# Patient Record
Sex: Male | Born: 1950 | Race: Black or African American | Hispanic: No | Marital: Married | Smoking: Former smoker
Health system: Southern US, Community
[De-identification: ages and names within clinical notes are randomized; demographics above are authoritative.]

## PROBLEM LIST (undated history)

## (undated) DIAGNOSIS — I10 Essential (primary) hypertension: Secondary | ICD-10-CM

## (undated) DIAGNOSIS — M109 Gout, unspecified: Secondary | ICD-10-CM

## (undated) DIAGNOSIS — E785 Hyperlipidemia, unspecified: Secondary | ICD-10-CM

## (undated) DIAGNOSIS — E119 Type 2 diabetes mellitus without complications: Secondary | ICD-10-CM

## (undated) HISTORY — PX: NECK SURGERY: SHX720

## (undated) HISTORY — PX: FOOT SURGERY: SHX648

## (undated) HISTORY — PX: KNEE SURGERY: SHX244

---

## 2001-04-15 ENCOUNTER — Ambulatory Visit (HOSPITAL_COMMUNITY): Admission: RE | Admit: 2001-04-15 | Discharge: 2001-04-15 | Payer: Self-pay | Admitting: Family Medicine

## 2001-04-15 ENCOUNTER — Encounter: Payer: Self-pay | Admitting: Family Medicine

## 2001-04-21 ENCOUNTER — Ambulatory Visit (HOSPITAL_COMMUNITY): Admission: RE | Admit: 2001-04-21 | Discharge: 2001-04-21 | Payer: Self-pay | Admitting: Family Medicine

## 2001-04-21 ENCOUNTER — Encounter: Payer: Self-pay | Admitting: Family Medicine

## 2002-01-23 ENCOUNTER — Ambulatory Visit (HOSPITAL_COMMUNITY): Admission: RE | Admit: 2002-01-23 | Discharge: 2002-01-23 | Payer: Self-pay | Admitting: Family Medicine

## 2002-01-23 ENCOUNTER — Encounter: Payer: Self-pay | Admitting: Family Medicine

## 2002-04-09 ENCOUNTER — Ambulatory Visit (HOSPITAL_COMMUNITY): Admission: RE | Admit: 2002-04-09 | Discharge: 2002-04-09 | Payer: Self-pay | Admitting: Neurosurgery

## 2002-09-04 ENCOUNTER — Encounter: Payer: Self-pay | Admitting: Rheumatology

## 2002-09-04 ENCOUNTER — Encounter (HOSPITAL_COMMUNITY): Admission: RE | Admit: 2002-09-04 | Discharge: 2002-10-04 | Payer: Self-pay | Admitting: Rheumatology

## 2002-10-19 ENCOUNTER — Encounter (HOSPITAL_COMMUNITY): Admission: RE | Admit: 2002-10-19 | Discharge: 2002-11-18 | Payer: Self-pay | Admitting: Rheumatology

## 2003-05-02 ENCOUNTER — Ambulatory Visit (HOSPITAL_COMMUNITY): Admission: RE | Admit: 2003-05-02 | Discharge: 2003-05-02 | Payer: Self-pay | Admitting: Orthopaedic Surgery

## 2003-05-02 ENCOUNTER — Encounter: Payer: Self-pay | Admitting: Orthopaedic Surgery

## 2003-08-24 ENCOUNTER — Emergency Department (HOSPITAL_COMMUNITY): Admission: EM | Admit: 2003-08-24 | Discharge: 2003-08-24 | Payer: Self-pay | Admitting: Emergency Medicine

## 2003-08-24 ENCOUNTER — Ambulatory Visit (HOSPITAL_COMMUNITY): Admission: RE | Admit: 2003-08-24 | Discharge: 2003-08-24 | Payer: Self-pay | Admitting: Emergency Medicine

## 2003-12-30 ENCOUNTER — Encounter: Admission: RE | Admit: 2003-12-30 | Discharge: 2003-12-30 | Payer: Self-pay | Admitting: Orthopedic Surgery

## 2004-01-11 ENCOUNTER — Encounter: Admission: RE | Admit: 2004-01-11 | Discharge: 2004-01-11 | Payer: Self-pay | Admitting: Orthopedic Surgery

## 2004-07-08 ENCOUNTER — Emergency Department (HOSPITAL_COMMUNITY): Admission: EM | Admit: 2004-07-08 | Discharge: 2004-07-08 | Payer: Self-pay | Admitting: *Deleted

## 2006-02-08 ENCOUNTER — Emergency Department (HOSPITAL_COMMUNITY): Admission: EM | Admit: 2006-02-08 | Discharge: 2006-02-08 | Payer: Self-pay | Admitting: Emergency Medicine

## 2007-08-19 ENCOUNTER — Ambulatory Visit: Payer: Self-pay | Admitting: Gastroenterology

## 2007-08-23 ENCOUNTER — Encounter: Payer: Self-pay | Admitting: Internal Medicine

## 2007-08-23 ENCOUNTER — Ambulatory Visit: Payer: Self-pay | Admitting: Internal Medicine

## 2007-08-23 ENCOUNTER — Ambulatory Visit (HOSPITAL_COMMUNITY): Admission: RE | Admit: 2007-08-23 | Discharge: 2007-08-23 | Payer: Self-pay | Admitting: Internal Medicine

## 2008-08-10 ENCOUNTER — Emergency Department (HOSPITAL_COMMUNITY): Admission: EM | Admit: 2008-08-10 | Discharge: 2008-08-10 | Payer: Self-pay | Admitting: Emergency Medicine

## 2011-01-20 NOTE — Consult Note (Signed)
NAMECHRISTINO, Ian Roberson             ACCOUNT NO.:  [id number]   MEDICAL RECORD NO.:  [id number]          PATIENT TYPE:  AMB   LOCATION:  DAY                           FACILITY:  [facility]   PHYSICIAN:  R. Roetta Sessions, M.D. DATE OF BIRTH:  1951/08/07   DATE OF CONSULTATION:  08/19/2007  DATE OF DISCHARGE:                                 CONSULTATION   CHIEF COMPLAINT:  Blood in stool.   PHYSICIAN REQUESTING CONSULTATION:  Dr. Floyde Parkins, who was with  the [facility] in [place], [place].   HISTORY OF PRESENT ILLNESS:  Ian Roberson is a 60 year old African-  American gentleman who presents today for further evaluation of  hemoccult-positive stools.  About 2 months ago, he completed take-home  Hemoccults, which were positive.  He had never had a colonoscopy.  Dr.  Jules Husbands has requested him having a colonoscopy at this time.  The patient  also has a history of esophageal strictures and Barrett's esophagus and  was actually supposed to come back for a 2-year follow-up surveillance  EGD, around 2004.  Somehow he was lost to followup.  His last EGD was in  March of 2002, and at that time he had biopsies consistent with  Barrett's, but no evidence of high-grade dysplasia.  He had 58-French  Maloney dilator passed, due to esophageal ring as well.  The patient  states he has had some mild recurrent esophageal solid food dysphagia.  Denies any odynophagia.  His heartburn is well controlled.  His weight  is stable.  No melena or rectal bleeding.  Bowel movements are regular.   CURRENT MEDICATIONS:  1. Methocarbamol 750 mg p.r.n.  2. Omeprazole 20 mg daily.  3. Vicodin and 10/500 mg p.r.n.  4. Trazodone 50 mg q.h.s. p.r.n.  5. Claritin 10 mg daily p.r.n.   ALLERGIES:  PENICILLIN CAUSES CONVULSIONS.   PAST MEDICAL HISTORY:  Gastroesophageal reflux disease, seasonal  allergies, history of esophageal strictures and Barrett's esophagus as  outlined above,  chronic neck and shoulder pain, chronic  back and hip  pain, heart murmur, right and left knee arthroscopy.   FAMILY HISTORY:  Mother is 65, has had a stroke.  Father died with a  massive MI at age 35.  No family history of colorectal cancer.   SOCIAL HISTORY:  He is married.  He has one son who died at age 41, one  son living and healthy.  He is on veteran's disability.  He quit smoking  in 1991.  He smoked for over 15 years.  He occasionally consumes  alcohol.   REVIEW OF SYSTEMS:  See HPI for GI and constitutional.  CARDIOPULMONARY:  Denies any chest pain, shortness of breath, chronic cough or  palpitations.   PHYSICAL EXAM:  VITAL SIGNS:  Weight 214, height 6 feet 2, temp 98.9,  blood pressure 130/88, pulse 88.  GENERAL:  Pleasant, well-nourished, well developed black male in no  acute distress.  SKIN:  Warm, dry.  No jaundice.  HEENT:  Sclerae nonicteric.  Oropharyngeal mucosa moist and pink.  No  lesions, erythema or exudate.  No lymphadenopathy or thyromegaly.  CHEST:  Lungs are clear to auscultation.  CARDIAC:  Exam reveals regular rate and rhythm.  Normal S1-S2.  No  murmurs, rubs or gallops.  ABDOMEN:  Positive bowel sounds.  Abdomen soft, nontender, nondistended.  No organomegaly or masses.  No rebound tenderness or guarding.  No  abdominal bruits or hernias.  EXTREMITIES:  No edema.   IMPRESSION:  Ian Roberson is a pleasant 60 year old gentleman who  presents with hemoccult-positive stools and recurrent dysphagia.  He has  never had a screening colonoscopy.  He does have a history of chronic  GERD with esophageal strictures and Barrett's esophagus and is overdue  for a surveillance EGD at this time.   RECOMMENDATIONS:  Recommend colonoscopy and EGD for further evaluation  of heme-positive stools and recurrent dysphagia.  I discussed the risk,  alternatives, benefits with regards to but not limited to the risk of  reaction to medications, bleeding, infection and perforation.  The  patient is agreeable to  proceed.   PLAN:  1. EGD and colonoscopy with Dr. Jena Gauss in the near future.  2. Continue omeprazole 20 mg daily.  3. Further recommendations to follow.   I would like to thank Dr. Floyde Parkins for allowing [place] to take part  in the care of this patient.      Tana Coast, P.AJonathon Bellows, M.D.  Electronically Signed    LL/MEDQ  D:  08/19/2007  T:  08/19/2007  Job:  [id number]   cc:   Floyde Parkins  Fax: [phone]

## 2011-01-20 NOTE — Op Note (Signed)
Ian Roberson, Ian Roberson             ACCOUNT NO.:  [id number]   MEDICAL RECORD NO.:  [id number]          PATIENT TYPE:  AMB   LOCATION:  DAY                           FACILITY:  [facility]   PHYSICIAN:  R. Roetta Sessions, M.D. DATE OF BIRTH:  1950-11-13   DATE OF PROCEDURE:  08/23/2007  DATE OF DISCHARGE:                               OPERATIVE REPORT   PROCEDURE:  Esophagogastroduodenoscopy with Elease Hashimoto dilation followed by  biopsy followed by diagnostic ileocolonoscopy.   INDICATIONS FOR PROCEDURE:  A 60 year old gentleman, hemoccult positive.  He has recurrent esophageal dysphagia and history of esophageal  stricture previously and reported Barrett's esophagus.  He is African-  [employer].  He has chronic gastroesophageal reflux disease.  He is on  omeprazole 20 mg orally daily.  There is no family history of colorectal  neoplasia.  He has never had a colonoscopy.  EGD and colonoscopy are now  being done.  The potential for esophageal dilation reviewed with Mr.  Roberson.  Risks, benefits and alternatives have been discussed.  All  questions answered.  All parties agreeable.   PROCEDURE NOTE:  O2 saturation, blood pressure, pulses, and respirations  were monitored throughout the entirety of both procedures.  Conscious  sedation with Versed 6 mg IV and Demerol 100 mg IV in divided doses.  Cetacaine spray for topical oropharyngeal anesthesia.   INSTRUMENT:  Pentax video chip system.   FINDINGS:  EGD examination of the tubular esophagus revealed a 2 cm  tongue of salmon-colored epithelium in the distal esophagus, which came  down to the EG junction, where there was a tight Schatzki's ring.  There  was no esophagitis, no evidence of neoplasia.  The EG junction would not  initially admit the scope with pressure.  There was a partial traumatic  dilation of the ring with passage of the scope.   STOMACH:  Gastric cavity was empty and insufflated well with air.  A  thorough examination of the  gastric mucosa, included a retroflexed view  of the proximal stomach and the esophagogastric junction demonstrated  only a hiatal hernia.  The pylorus was patent and easily transversed.  Examination of the bulb and second portion revealed no abnormalities.   THERAPEUTICS/DIAGNOSTIC MANEUVERS PERFORMED:  The scope was withdrawn.  A 56 French Maloney dilator was passed to full insertion.  A look-back  revealed there was excellent rupture of the ring without apparent  complication.  Subsequently, the salmon-colored tongue of epithelium was  biopsied for histologic study.  The patient tolerated the procedure well  and was prepared for colonoscopy.   Digital rectal exam revealed no abnormalities.   ENDOSCOPIC FINDINGS:  Prep was marginal.   COLON:  The colonic mucosa was surveyed from the rectosigmoid junction  to the left transverse, right colon, the appendiceal orifice, the  ileocecal valve, and cecum.  These structures were well seen and  photographed for the record.  The terminal ileum was intubated to 10 cm.  From this level, the scope was slowly withdrawn, and all previously  mentioned mucosal surfaces were again seen.  There was a thin coating of  viscus stool  throughout the colon, which was washed and lavaged out to  gain adequate visualization.  The colonic mucosa, I did not see any  evidence of neoplasm or polyp in reviewing the colonic mucosa on the way  out.  I took at least 10 minutes on withdrawal; however, the prep made  the exam more difficult, and the air/water button on the colonoscope  kept sticking and had to be changed out, which was frustrating.  The  scope was pulled down into the rectum, where a thorough examination of  the rectal mucosa, including retroflexion of the anal verge,  demonstrated no abnormalities.  Patient tolerated both procedures well  and was reactive.   ENDOSCOPY IMPRESSION:  1. A tongue of salmon-colored epithelium in the distal esophagus, a       tight Schatzki's ring, partially dilated with passage of the scope      and then dilated with a 56 French Maloney dilator, status post      biopsy of the distal esophagus.  2. Hiatal hernia of his lower stomach.  Patent pylorus, normal D1, D2.   COLONOSCOPY FINDINGS:  Normal rectum and ileum.  Marginal prep made the  exam more difficult.   RECOMMENDATIONS:  1. Continue omeprazole 20 mg orally daily.  2. Follow up on path.  3. CBC today.  If he is anemic, will consider further evaluation of      his GI tract to evaluate hemoccult positive stool.  It is notable      that he has been taking diclofenac recently.      Ian Roberson, M.D.  Electronically Signed     RMR/MEDQ  D:  08/23/2007  T:  08/23/2007  Job:  [id number]   cc:   Floyde Parkins  Fax: [phone]   [facility]

## 2011-01-23 NOTE — Consult Note (Signed)
NAMEJAKIAH, GOREE                         ACCOUNT NO.:  [id number]   MEDICAL RECORD NO.:  [id number]                   PATIENT TYPE:  OUT   LOCATION:  RAD                                  FACILITY:  [facility]   PHYSICIAN:  Aundra Dubin, M.D.            DATE OF BIRTH:  15-Jul-1951   DATE OF CONSULTATION:  DATE OF DISCHARGE:  04/09/2002                                   CONSULTATION   CHIEF COMPLAINT:  Neck and shoulder pain.   REFERRING PHYSICIAN:  J. Darreld Mclean, M.D.   Dear Deniece Portela:   Thank you for this consultation.  Mr. Hillery is a 60 year old white male  who has had several areas of arthralgias.  I have read through Dr. Sanjuan Dame  notes as he has seen Mr. Wieck approximately seven to eight times from  05/03/02 to 08/11/02.   Concerning the shoulder and neck pain, which is much worse on the right than  the left, this began in 4/02.  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  This is a constant pain throughout the neck  and shoulder.  He also describes it as sharp and pinching.  He finds that it  is quite hard to sleep because of the pain  in the right shoulder.  He feels  that it is somewhat difficult to lift.   Mr. Manna has worked with Dr. Hilda Lias concerning right hip and knee pain.  This type of pain began in 3/03.  He was evaluated by Dr. Lovell Sheehan, a  neurosurgeon.  He had a normal MRI of the hip.  He had a lumbar spine MRI on  01/23/02 showing mild disk degeneration at L2-3, L3-4, and L4-5.  It was also  noting mild diffuse disk bulging at the L2-3 level.  There was no  significant stenosis or HNP.  Mr. Hoopes describes having severe pain in  the right hip and knee when standing longer than 15 or 20 minutes.  He says  it starts around the kneecap and moves upward and points to the superior  lateral iliac crest.  He feels that there is gristle that pops  in this  area.  After he stands he says the whole leg will hurt.  He describes this  as a sharp pain.  There is radiation from the lateral hip area to the back.  He has had arthroscopy to the right knee by Dr. Hilda Lias in 1999.  The MRI of  the right knee on 04/09/02 showed an intact lateral meniscus and a tear to the  body and posterior horn of the medial meniscus.   On further review of systems he denies any joint swelling.  He denies fever,  rashes, or weight loss.  His energy is reasonable and he says it is fairly  well.  He has the nonrestorative sleep.  There has been psoriasis,  photosensitivity, or loss of alopecia, pleurisy, headaches, Raynaud's, or  significant vision changes.  Sometimes his eyes will burn but I doubt this  is an inflammatory symptom.  He denies diarrhea, constipation, blood or  mucus in the bowel movement.  He had chest pain every three to four months  in the center of the chest that was burning and tight.  He underwent a  stress test which I believe was negative.   Past medical and surgical history, with reflux.  Right knee arthroscopy in  1999.  Right hip, shoulder, neck, and back pain as described above.  Endoscopy in 2002.  A cyst removed from the right abdomen.  Left knee  arthroscopy in 1996.   Medicines, he recently finished a Medrol Dosepak.  Advil p.r.n.  Prevacid  p.r.n.  He is presently not taking Celebrex.   His drug intolerances are penicillin and cortisone.   Family history is with no significant history of arthritis.   Social history, he is presently out of work.  He does not smoke or drink  alcohol.   VITAL SIGNS: His weight is 215 pounds, height 6 feet 2 inches.  Blood  pressure 140/92, respirations 16.  GENERAL: In general, this is a healthy  appearing man.  SKIN:  Clear.  HEENT: PERRL/EOMI.  Mouth moderately poor dentition.  No ulcers or petechia.  NECK: Normal thyroid.  Negative JVD.  LUNGS: Clear.  HEART: Regular.  No murmur.   ABDOMEN: Negative.  Nontender.  MUSCULOSKELETAL: The hands, wrists, elbows, and shoulders all have a good  range of motion and show no pain.  There was minor wincing with completely  full internal and external range of motion to the shoulders.  He was able to  actively put the shoulders through a range of motion that showed no wincing  or deficit.  He was able to abduct the shoulders easily to 180 degrees with  no problems.  Trigger points at the elbows, shoulders, neck, occiput,  anterior chest, and lumbar paraspinous muscles were nontender except  slightly to the tops of the shoulders.  He was able to stand on a forward  flexion and touch the floor fairly easily.  The second time he went down he  said that there was some light low back pain.  Hips, knees, ankles, and feet  have a painless full range of motion but showed no synovitis.  The right  knee was cool and nontender with flexion to 135 degrees. There was no joint  line tenderness.  Gait normal.  NEUROLOGIC: Strength was 5/5.  DTRs were 2+ throughout and negative SLR.   ASSESSMENT:   PLAN:  1. Shoulder and neck.  As I have discussed with the patient I find that his     entire examination is normal.  There was no guarding with any movement of     any joints and they were nontender to palpation.  I do not think that the     shoulder requires an injection and will not do this.  I will have the     shoulder x-rayed.  I believe it is reasonable to have go for a few     sessions of physical therapy to learn a range of motion that he can do at     home.  2. Insomnia.  I do not believe that this is a fibromyalgia process.  He is     not sleeping  well because of the shoulder pain and I have placed him on     Flexeril to see if this will help with his sleep and also have given him     Darvocet that he can use during the day. I have also encouraged him to     return using Celebrex 200 mg q.d. 3. Back pain.  The examination is normal.  He  has had essentially     unremarkable MRIs of the thoracic, lumbar, and hip.  I do not believe     that this is a radiating type of pain and will not work it up further as     it already has been.  He is some improved from a recent Medrol Dosepak     and he says that this dulled the pain.  4. Right knee.  The knee has a problem with the posterior meniscus but the     examination is quite normal.  His gait was normal also.   Wayne, as above I find that Mr. Jagoda appears healthy and has a  completely normal examination.  I do not have a full explanation as to why  he is aching and hurting so much.  I await the results of the x-rays and we  will check a  CBC, CMET, and ESR.  I will see him back in about five to six weeks to  further evaluate him.  At the present time I do not believe that he has  fibromyalgia.  Thank you.   Sincerely,                                                 Aundra Dubin, M.D.    WWT/MEDQ  D:  09/04/2002  T:  09/04/2002  Job:  [id number]

## 2011-01-23 NOTE — Consult Note (Signed)
Ian Roberson, Ian Roberson NO.:  [id number]   MEDICAL RECORD NO.:  [id number]                   PATIENT TYPE:   LOCATION:                                       FACILITY:  [facility]   PHYSICIAN:  Aundra Dubin, M.D.            DATE OF BIRTH:  06/27/1951   DATE OF CONSULTATION:  10/19/2002  DATE OF DISCHARGE:                                   CONSULTATION   CHIEF COMPLAINT:  Neck and shoulder pain, back pain.   HISTORY OF PRESENT ILLNESS:  The patient did go for physical therapy and  reports that after about the third session he began to have significant  reduction in the shoulder and neck, tightness and pain.  He says that he can  hardly feel it now.  He is sleeping better and did not need much of the  Flexeril.  He does find that the Darvocet helps him sleep at night.  At this  time he says that he is still having some difficulty with his right low back  and pain down to the right knee.  In review of the chart he had some mild  disk degeneration from L2 to L5 by an MRI.  I have reviewed his right  shoulder x-ray from September 04, 2002 which is essentially normal except for  a benign bony finding of os acromiale.  Laboratories from September 04, 2002  showed an AST 88, ALT 86, bilirubin 1.1, glucose 105, creatinine 1.0.  WBC  8.3, HGB 14.6, PLT 226,000, ESR 2.   DISCHARGE MEDICATIONS:  1. Darvocet one daily.  2. Prevacid p.r.n.  3. Off Celebrex.  4. Flexeril rare.   PHYSICAL EXAMINATION:  VITAL SIGNS:  Weight 214 pounds, blood pressure  122/80, respirations 16.  GENERAL:  He appears well.  Gait nonantalgic.  SKIN:  Clear.  LUNGS:  Clear.  HEART:  Regular.  No murmur.  EXTREMITIES:  Lower extremities:  No edema.  MUSCULOSKELETAL:  The hands, wrists, elbows, shoulders, and neck have a good  range of motion and now there is no stiffness to the shoulders.  Trigger  points around the shoulder, neck, occiput, anterior chest, and throughout  the  paraspinous muscles and low back are all nontender.  Hips:  Full range  of motion without pain.  The knees are cool.  The right knee has some slight  fullness compared to the left.  There is no joint line tenderness and no  pain with flexion to 135 degrees.   ASSESSMENT/PLAN:  1. Shoulder and neck pain.  This was his main complaint when I saw him in     late December and this is significantly improved.  He will continue with     the Darvocet p.r.n.  The physical therapist working with the patient     rates his improvement at 97%.  He is released to go back to work.  2. Back pain.  His examination seems overall quite good.  I would allow him     to return to work if this is okayed by     his company physician.  3. Increased LFTs.  We will repeat these tests.  He did have negative     antibodies to hepatitis B and C.  4. He will return on a p.r.n. basis.                                               Aundra Dubin, M.D.    WWT/MEDQ  D:  10/19/2002  T:  10/19/2002  Job:  [id number]   cc:   Teola Bradley, M.D.  [phone] [address]  [place] [address]  Fax: [phone]

## 2011-06-11 LAB — DIFFERENTIAL
Basophils Absolute: 0 10*3/uL (ref 0.0–0.1)
Basophils Relative: 1 % (ref 0–1)
Eosinophils Absolute: 0.2 10*3/uL (ref 0.0–0.7)
Eosinophils Relative: 2 % (ref 0–5)
Lymphocytes Relative: 31 % (ref 12–46)
Lymphs Abs: 2.4 10*3/uL (ref 0.7–4.0)
Monocytes Absolute: 0.4 10*3/uL (ref 0.1–1.0)
Monocytes Relative: 5 % (ref 3–12)
Neutro Abs: 4.8 10*3/uL (ref 1.7–7.7)
Neutrophils Relative %: 62 % (ref 43–77)

## 2011-06-11 LAB — CBC
MCHC: 33.7 g/dL (ref 30.0–36.0)
RDW: 13.3 % (ref 11.5–15.5)

## 2011-06-11 LAB — COMPREHENSIVE METABOLIC PANEL
ALT: 28 U/L (ref 0–53)
AST: 33 U/L (ref 0–37)
BUN: 8 mg/dL (ref 6–23)
Chloride: 104 mEq/L (ref 96–112)
Creatinine, Ser: 0.95 mg/dL (ref 0.4–1.5)
GFR calc Af Amer: >60 mL/min (ref >60)
Glucose, Bld: 122 mg/dL — ABNORMAL HIGH (ref 70–99)

## 2011-06-11 LAB — POCT CARDIAC MARKERS
CKMB, poc: <1 ng/mL — ABNORMAL LOW (ref 1.0–8.0)
Troponin i, poc: <0.05 ng/mL (ref 0.00–0.09)
Troponin i, poc: <0.05 ng/mL (ref 0.00–0.09)

## 2011-06-11 LAB — D-DIMER, QUANTITATIVE: D-Dimer, Quant: 0.23 ug/mL-FEU (ref 0.00–0.48)

## 2011-06-12 LAB — CBC
MCHC: 33.4
MCV: 83.9
Platelets: 230
RDW: 13.2

## 2012-02-14 ENCOUNTER — Encounter (HOSPITAL_COMMUNITY): Payer: Self-pay | Admitting: Emergency Medicine

## 2012-02-14 ENCOUNTER — Emergency Department (HOSPITAL_COMMUNITY): Payer: Medicare Other

## 2012-02-14 ENCOUNTER — Emergency Department (HOSPITAL_COMMUNITY)
Admission: EM | Admit: 2012-02-14 | Discharge: 2012-02-15 | Disposition: A | Discharge status: Home / Self Care | Payer: Medicare Other | Attending: Emergency Medicine | Admitting: Emergency Medicine

## 2012-02-14 DIAGNOSIS — R079 Chest pain, unspecified: Secondary | ICD-10-CM | POA: Insufficient documentation

## 2012-02-14 DIAGNOSIS — E785 Hyperlipidemia, unspecified: Secondary | ICD-10-CM | POA: Insufficient documentation

## 2012-02-14 DIAGNOSIS — Z8249 Family history of ischemic heart disease and other diseases of the circulatory system: Secondary | ICD-10-CM | POA: Insufficient documentation

## 2012-02-14 DIAGNOSIS — I10 Essential (primary) hypertension: Secondary | ICD-10-CM | POA: Insufficient documentation

## 2012-02-14 DIAGNOSIS — R109 Unspecified abdominal pain: Secondary | ICD-10-CM | POA: Insufficient documentation

## 2012-02-14 HISTORY — DX: Hyperlipidemia, unspecified: E78.5

## 2012-02-14 HISTORY — DX: Essential (primary) hypertension: I10

## 2012-02-14 HISTORY — DX: Gout, unspecified: M10.9

## 2012-02-14 LAB — BASIC METABOLIC PANEL
BUN: 12 mg/dL (ref 6–23)
Calcium: 10.5 mg/dL (ref 8.4–10.5)
Chloride: 98 mEq/L (ref 96–112)
Creatinine, Ser: 1 mg/dL (ref 0.50–1.35)
Potassium: 3.6 mEq/L (ref 3.5–5.1)

## 2012-02-14 LAB — CBC
HCT: 41 % (ref 39.0–52.0)
MCHC: 34.4 g/dL (ref 30.0–36.0)
Platelets: 220 10*3/uL (ref 150–400)
RBC: 4.96 MIL/uL (ref 4.22–5.81)
RDW: 13.2 % (ref 11.5–15.5)

## 2012-02-14 LAB — LIPASE, BLOOD: Lipase: 35 U/L (ref 11–59)

## 2012-02-14 LAB — POCT I-STAT TROPONIN I

## 2012-02-14 MED ORDER — ASPIRIN 81 MG PO CHEW
324.0000 mg | CHEWABLE_TABLET | Freq: Once | ORAL | Status: AC
  Administered 2012-02-14: 324 mg via ORAL
  Filled 2012-02-14: qty 4

## 2012-02-14 MED ORDER — MORPHINE SULFATE 4 MG/ML IJ SOLN
4.0000 mg | Freq: Once | INTRAMUSCULAR | Status: AC
  Administered 2012-02-14: 4 mg via INTRAVENOUS
  Filled 2012-02-14: qty 1

## 2012-02-14 MED ORDER — ONDANSETRON HCL 4 MG/2ML IJ SOLN
4.0000 mg | Freq: Once | INTRAMUSCULAR | Status: AC
  Administered 2012-02-14: 4 mg via INTRAVENOUS
  Filled 2012-02-14: qty 2

## 2012-02-14 NOTE — ED Notes (Signed)
Patient complaining of sharp chest pain starting today about 1900.

## 2012-02-15 ENCOUNTER — Emergency Department (HOSPITAL_COMMUNITY): Payer: Medicare Other

## 2012-02-15 LAB — HEPATIC FUNCTION PANEL
ALT: 35 U/L (ref 0–53)
AST: 46 U/L — ABNORMAL HIGH (ref 0–37)
Alkaline Phosphatase: 69 U/L (ref 39–117)
Bilirubin, Direct: 0.1 mg/dL (ref 0.0–0.3)
Indirect Bilirubin: 0.3 mg/dL (ref 0.3–0.9)
Total Bilirubin: 0.4 mg/dL (ref 0.3–1.2)

## 2012-02-15 MED ORDER — HYDROCODONE-ACETAMINOPHEN 5-325 MG PO TABS: 1.0000 | ORAL_TABLET | Freq: Four times a day (QID) | ORAL | Status: AC | PRN

## 2012-02-15 MED ORDER — IOHEXOL 350 MG/ML SOLN
100.0000 mL | Freq: Once | INTRAVENOUS | Status: AC | PRN
  Administered 2012-02-15: 100 mL via INTRAVENOUS

## 2012-02-15 MED ORDER — FAMOTIDINE 20 MG PO TABS: 20.0000 mg | ORAL_TABLET | Freq: Two times a day (BID) | ORAL | Status: DC

## 2012-02-15 NOTE — ED Provider Notes (Signed)
History     CSN: [id number]  Arrival date & time 02/14/12  2218   First MD Initiated Contact with Patient 02/14/12 2234      Chief Complaint  Patient presents with  . Chest Pain    (Consider location/radiation/quality/duration/timing/severity/associated sxs/prior treatment) HPI Comments: Ian Roberson was relaxing at home when he developed sudden onset of midsternal sharp chest pain which radiates around his right lower ribs and in in his right flank.  His pain is sharp and constant but worse with deep inspiration.  He denies nausea vomiting diaphoresis and shortness of breath.  He denies any previous episodes of similar symptoms.  His last meal was approximately 5 hours before this event and was not particularly fatty, stating he had corn, green beans and pork roast.  There have been no identifiable triggers for his symptoms as they occurred at rest.  Past medical history is significant for hypertension and hyperlipidemia.  He also has a strong family history of cardiac disease, his father died of a massive MI at age 60.  His primary doctor is at the [facility] in [place].  He has taken no medications prior to arrival here.   Patient is a 61 y.o. male presenting with chest pain. The history is provided by the patient.  Chest Pain Pertinent negatives for primary symptoms include no fever, no shortness of breath, no abdominal pain, no nausea and no dizziness.  Pertinent negatives for associated symptoms include no numbness and no weakness.     Past Medical History  Diagnosis Date  . Hypertension   . Hyperlipidemia   . Gout     Past Surgical History  Procedure Date  . Knee surgery   . Neck surgery     History reviewed. No pertinent family history.  History  Substance Use Topics  . Smoking status: Former Games developer  . Smokeless tobacco: Not on file  . Alcohol Use: Yes     weekends      Review of Systems  Constitutional: Negative for fever.  HENT: Negative for congestion,  sore throat and neck pain.   Eyes: Negative.   Respiratory: Negative for chest tightness and shortness of breath.   Cardiovascular: Positive for chest pain.  Gastrointestinal: Negative for nausea and abdominal pain.  Genitourinary: Negative.   Musculoskeletal: Negative for joint swelling and arthralgias.  Skin: Negative.  Negative for rash and wound.  Neurological: Negative for dizziness, weakness, light-headedness, numbness and headaches.  Hematological: Negative.   Psychiatric/Behavioral: Negative.     Allergies  Penicillins  Home Medications  No current outpatient prescriptions on file.  BP 148/85  Pulse 70  Temp(Src) 98.1 F (36.7 C) (Oral)  Resp 20  Ht 6\' 2"  (1.88 m)  Wt 230 lb (104.327 kg)  BMI 29.53 kg/m2  SpO2 99%  Physical Exam  Nursing note and vitals reviewed. Constitutional: He appears well-developed and well-nourished.  HENT:  Head: Normocephalic and atraumatic.  Eyes: Conjunctivae are normal.  Neck: Normal range of motion.  Cardiovascular: Normal rate, regular rhythm, normal heart sounds and intact distal pulses.  Exam reveals no friction rub.   No murmur heard. Pulmonary/Chest: Effort normal and breath sounds normal. He has no wheezes. He exhibits tenderness.    Abdominal: Soft. Bowel sounds are normal. There is no hepatosplenomegaly. There is tenderness in the right upper quadrant. There is no rebound, no guarding, no CVA tenderness and negative Murphy's sign.    Musculoskeletal: Normal range of motion.  Neurological: He is alert.  Skin: Skin is warm  and dry.  Psychiatric: He has a normal mood and affect.    ED Course  Procedures (including critical care time)  Labs Reviewed  BASIC METABOLIC PANEL - Abnormal; Notable for the following:    Sodium 134 (*)    Glucose, Bld 126 (*)    GFR calc non Af Amer 80 (*)    All other components within normal limits  HEPATIC FUNCTION PANEL - Abnormal; Notable for the following:    AST 46 (*)    All other  components within normal limits  D-DIMER, QUANTITATIVE - Abnormal; Notable for the following:    D-Dimer, Quant 0.64 (*)    All other components within normal limits  CBC  POCT I-STAT TROPONIN I  LIPASE, BLOOD   Dg Chest Portable 1 View  02/14/2012  *RADIOLOGY REPORT*  Clinical Data: Chest pain  PORTABLE CHEST - 1 VIEW  Comparison: 08/10/2008  Findings: Heart size upper normal limits.  Mediastinal contours otherwise within normal range.  No focal consolidation.  No pleural effusion or pneumothorax.  No acute osseous finding.  IMPRESSION: No radiographic evidence of acute cardiopulmonary process.  Original Report Authenticated By: Waneta Martins, M.D.     No diagnosis found.  Patient received near complete relief of pain with morphine 4 mg IV.  Also given asa 324 mg PO.    MDM  Discussed case with Dr. Deretha Emory who agrees with plan for ct abd/pelvis to rule out gallbladder source of pain.  With elevated d dimer,  CT angio to be completed at same time to rule out PE given pleuritic nature of sx.  Dr. Deretha Emory to assume care of patient.    Date: 02/14/2012  Rate: 89  Rhythm: normal sinus rhythm and premature ventricular contractions (PVC)  QRS Axis: left  Intervals: QT prolonged  ST/T Wave abnormalities: normal  Conduction Disutrbances:none  Narrative Interpretation:   Old EKG Reviewed: unchanged          Burgess Amor, PA 02/15/12 0150

## 2012-02-15 NOTE — Discharge Instructions (Signed)
Workup in the emergency department the negative which included a CAT scan of your chest and a CAT scan of your abdomen and pelvis. As well as the cardiac markers were negative. [facility]. Return for new or worse symptoms. Recommend a trial of Pepcid 20 mg twice a day for the next 2 weeks.

## 2012-02-15 NOTE — ED Provider Notes (Addendum)
Medical screening examination/treatment/procedure(s) were conducted as a shared visit with non-physician practitioner(s) and myself.  I personally evaluated the patient during the encounter  Patient seen by me 2 concerns one is elevated d-dimer with CT angiogram rule out PE. Not likely to be any acute cardiac event the other concern is symptoms very suggestive of biliary colic and the symptoms have persisted CT of the abdomen and pelvis to rule out gallstones as well as any other abnormalities in the right upper quadrant. A CT angiogram CT abdomen pelvis we followed up by me.    Results for orders placed during the hospital encounter of 02/14/12  CBC      Component Value Range   WBC 9.0  4.0 - 10.5 (K/uL)   RBC 4.96  4.22 - 5.81 (MIL/uL)   Hemoglobin 14.1  13.0 - 17.0 (g/dL)   HCT 78.2  95.6 - 21.3 (%)   MCV 82.7  78.0 - 100.0 (fL)   MCH 28.4  26.0 - 34.0 (pg)   MCHC 34.4  30.0 - 36.0 (g/dL)   RDW 08.6  57.8 - 46.9 (%)   Platelets 220  150 - 400 (K/uL)  BASIC METABOLIC PANEL      Component Value Range   Sodium 134 (*) 135 - 145 (mEq/L)   Potassium 3.6  3.5 - 5.1 (mEq/L)   Chloride 98  96 - 112 (mEq/L)   CO2 23  19 - 32 (mEq/L)   Glucose, Bld 126 (*) 70 - 99 (mg/dL)   BUN 12  6 - 23 (mg/dL)   Creatinine, Ser 6.29  0.50 - 1.35 (mg/dL)   Calcium 52.8  8.4 - 10.5 (mg/dL)   GFR calc non Af Amer 80 (*) >90 (mL/min)   GFR calc Af Amer >90  >90 (mL/min)  POCT I-STAT TROPONIN I      Component Value Range   Troponin i, poc 0.02  0.00 - 0.08 (ng/mL)   Comment 3           LIPASE, BLOOD      Component Value Range   Lipase 35  11 - 59 (U/L)  HEPATIC FUNCTION PANEL      Component Value Range   Total Protein 7.7  6.0 - 8.3 (g/dL)   Albumin 4.3  3.5 - 5.2 (g/dL)   AST 46 (*) 0 - 37 (U/L)   ALT 35  0 - 53 (U/L)   Alkaline Phosphatase 69  39 - 117 (U/L)   Total Bilirubin 0.4  0.3 - 1.2 (mg/dL)   Bilirubin, Direct 0.1  0.0 - 0.3 (mg/dL)   Indirect Bilirubin 0.3  0.3 - 0.9 (mg/dL)  D-DIMER,  QUANTITATIVE      Component Value Range   D-Dimer, Quant 0.64 (*) 0.00 - 0.48 (ug/mL-FEU)   Ct Angio Chest W/cm &/or Wo Cm  02/15/2012  *RADIOLOGY REPORT*  Clinical Data: Chest pain  CT ANGIOGRAPHY CHEST  Technique:  Multidetector CT imaging of the chest using the standard protocol during bolus administration of intravenous contrast. Multiplanar reconstructed images including MIPs were obtained and reviewed to evaluate the vascular anatomy.  Contrast: OMNIPAQUE IOHEXOL 350 MG/ML SOLN  Comparison: 02/14/2012 radiograph  Findings: No pulmonary arterial branch filling defect identified. Normal caliber aorta.  Heart size upper normal to mildly enlarged. Trace pericardial fluid.  No pleural effusion.  Mild distal esophageal wall thickening.  No intrathoracic lymphadenopathy.  Central airways are patent.  Minimal dependent atelectasis.  Lungs are otherwise clear.  No pneumothorax.  See separate abdomen  report.  No acute osseous finding.  Multilevel degenerative changes.  IMPRESSION: No pulmonary embolism.  Cardiomegaly and trace pericardial fluid.  Distal esophageal wall thickening is nonspecific however can be seen with gastroesophageal reflux disease or esophagitis.  Otherwise, no acute intrathoracic process identified.  Original Report Authenticated By: Waneta Martins, M.D.   Ct Abdomen Pelvis W Contrast  02/15/2012  *RADIOLOGY REPORT*  Clinical Data: Chest pain, abdominal pain.  CT ABDOMEN AND PELVIS WITH CONTRAST  Technique:  Multidetector CT imaging of the abdomen and pelvis was performed following the standard protocol during bolus administration of intravenous contrast.  Contrast: OMNIPAQUE IOHEXOL 350 MG/ML SOLN  Comparison: None.  Findings: See separate chest report.  Unremarkable liver, biliary system, spleen, pancreas, adrenal glands.  Symmetric renal enhancement.  No hydronephrosis or hydroureter.  No urinary tract calculi identified.  No bowel obstruction.  No CT evidence for colitis.   Appendix within normal limits.  No free intraperitoneal air or fluid.  No lymphadenopathy.  There is scattered atherosclerotic calcification of the aorta and its branches. No aneurysmal dilatation.  Multilevel degenerative changes of the imaged spine. No acute or aggressive appearing osseous lesion.  IMPRESSION: No acute abnormality identified by CT within the abdomen pelvis.  Original Report Authenticated By: Waneta Martins, M.D.   Dg Chest Portable 1 View  02/14/2012  *RADIOLOGY REPORT*  Clinical Data: Chest pain  PORTABLE CHEST - 1 VIEW  Comparison: 08/10/2008  Findings: Heart size upper normal limits.  Mediastinal contours otherwise within normal range.  No focal consolidation.  No pleural effusion or pneumothorax.  No acute osseous finding.  IMPRESSION: No radiographic evidence of acute cardiopulmonary process.  Original Report Authenticated By: Waneta Martins, M.D.     Patient scans are negative for any evidence of gallstones also negative for pulmonary embolism. Patient troponin was negative after 6 hours findings not consistent with a 2 cardiac event we'll treat with pain medicine and discharged home and followup with her regular doctor.   Shelda Jakes, MD 02/15/12 1610  Shelda Jakes, MD 02/15/12 [id number]

## 2013-09-25 ENCOUNTER — Emergency Department (HOSPITAL_COMMUNITY)
Admission: EM | Admit: 2013-09-25 | Discharge: 2013-09-25 | Disposition: A | Discharge status: Home / Self Care | Payer: Medicare Other | Attending: Emergency Medicine | Admitting: Emergency Medicine

## 2013-09-25 ENCOUNTER — Encounter (HOSPITAL_COMMUNITY): Payer: Self-pay | Admitting: Emergency Medicine

## 2013-09-25 ENCOUNTER — Emergency Department (HOSPITAL_COMMUNITY): Payer: Medicare Other

## 2013-09-25 DIAGNOSIS — I10 Essential (primary) hypertension: Secondary | ICD-10-CM | POA: Insufficient documentation

## 2013-09-25 DIAGNOSIS — Z880 Allergy status to penicillin: Secondary | ICD-10-CM | POA: Insufficient documentation

## 2013-09-25 DIAGNOSIS — Z87891 Personal history of nicotine dependence: Secondary | ICD-10-CM | POA: Insufficient documentation

## 2013-09-25 DIAGNOSIS — E785 Hyperlipidemia, unspecified: Secondary | ICD-10-CM | POA: Insufficient documentation

## 2013-09-25 DIAGNOSIS — J111 Influenza due to unidentified influenza virus with other respiratory manifestations: Secondary | ICD-10-CM | POA: Insufficient documentation

## 2013-09-25 DIAGNOSIS — J069 Acute upper respiratory infection, unspecified: Secondary | ICD-10-CM | POA: Insufficient documentation

## 2013-09-25 LAB — CBC WITH DIFFERENTIAL/PLATELET
BASOS ABS: 0 10*3/uL (ref 0.0–0.1)
Basophils Relative: 0 % (ref 0–1)
Eosinophils Absolute: 0.3 10*3/uL (ref 0.0–0.7)
Eosinophils Relative: 3 % (ref 0–5)
HEMATOCRIT: 44.9 % (ref 39.0–52.0)
HEMOGLOBIN: 15.4 g/dL (ref 13.0–17.0)
LYMPHS PCT: 9 % — AB (ref 12–46)
Lymphs Abs: 1.1 10*3/uL (ref 0.7–4.0)
MCH: 28.4 pg (ref 26.0–34.0)
MCHC: 34.3 g/dL (ref 30.0–36.0)
MCV: 82.8 fL (ref 78.0–100.0)
MONO ABS: 0.8 10*3/uL (ref 0.1–1.0)
MONOS PCT: 7 % (ref 3–12)
NEUTROS ABS: 9 10*3/uL — AB (ref 1.7–7.7)
Neutrophils Relative %: 80 % — ABNORMAL HIGH (ref 43–77)
Platelets: 198 10*3/uL (ref 150–400)
RBC: 5.42 MIL/uL (ref 4.22–5.81)
RDW: 13.2 % (ref 11.5–15.5)
WBC: 11.2 10*3/uL — AB (ref 4.0–10.5)

## 2013-09-25 LAB — BASIC METABOLIC PANEL
BUN: 11 mg/dL (ref 6–23)
CHLORIDE: 99 meq/L (ref 96–112)
CO2: 23 meq/L (ref 19–32)
CREATININE: 1.03 mg/dL (ref 0.50–1.35)
Calcium: 9.5 mg/dL (ref 8.4–10.5)
GFR calc Af Amer: 89 mL/min — ABNORMAL LOW (ref >90)
GFR calc non Af Amer: 77 mL/min — ABNORMAL LOW (ref >90)
Glucose, Bld: 106 mg/dL — ABNORMAL HIGH (ref 70–99)
POTASSIUM: 3.8 meq/L (ref 3.7–5.3)
Sodium: 138 mEq/L (ref 137–147)

## 2013-09-25 LAB — TROPONIN I

## 2013-09-25 MED ORDER — PREDNISONE 50 MG PO TABS
60.0000 mg | ORAL_TABLET | Freq: Once | ORAL | Status: AC
  Administered 2013-09-25: 60 mg via ORAL
  Filled 2013-09-25 (×2): qty 1

## 2013-09-25 MED ORDER — HYDROCOD POLST-CHLORPHEN POLST 10-8 MG/5ML PO LQCR: 5.0000 mL | Freq: Two times a day (BID) | ORAL | Status: DC | PRN

## 2013-09-25 MED ORDER — PREDNISONE 20 MG PO TABS: ORAL_TABLET | ORAL | Status: DC

## 2013-09-25 MED ORDER — IPRATROPIUM BROMIDE 0.02 % IN SOLN
0.5000 mg | Freq: Once | RESPIRATORY_TRACT | Status: AC
  Administered 2013-09-25: 0.5 mg via RESPIRATORY_TRACT
  Filled 2013-09-25: qty 2.5

## 2013-09-25 MED ORDER — ALBUTEROL SULFATE (2.5 MG/3ML) 0.083% IN NEBU
5.0000 mg | INHALATION_SOLUTION | Freq: Once | RESPIRATORY_TRACT | Status: AC
  Administered 2013-09-25: 5 mg via RESPIRATORY_TRACT
  Filled 2013-09-25: qty 6

## 2013-09-25 NOTE — ED Provider Notes (Signed)
CSN: [id number]     Arrival date & time 09/25/13  1709 History   First MD Initiated Contact with Patient 09/25/13 1807     Chief Complaint  Patient presents with  . Influenza  . Shortness of Breath   (Consider location/radiation/quality/duration/timing/severity/associated sxs/prior Treatment) HPI.... cough, shortness of breath, fever since Saturday. Nonsmoker. Exertion makes symptoms worse. He is coughing incessantly. No chills or rusty sputum. Severity is moderate. Has tried over-the-counter products.  Past Medical History  Diagnosis Date  . Hypertension   . Hyperlipidemia   . Gout    Past Surgical History  Procedure Laterality Date  . Knee surgery    . Neck surgery    . Foot surgery     No family history on file. History  Substance Use Topics  . Smoking status: Former Games developermoker  . Smokeless tobacco: Not on file  . Alcohol Use: Yes     Comment: weekends    Review of Systems  All other systems reviewed and are negative.    Allergies  Penicillins  Home Medications   Current Outpatient Rx  Name  Route  Sig  Dispense  Refill  . chlorpheniramine-HYDROcodone (TUSSIONEX PENNKINETIC ER) 10-8 MG/5ML LQCR   Oral   Take 5 mLs by mouth every 12 (twelve) hours as needed for cough.   120 mL   0   . EXPIRED: famotidine (PEPCID) 20 MG tablet   Oral   Take 1 tablet (20 mg total) by mouth 2 (two) times daily.   30 tablet   0   . predniSONE (DELTASONE) 20 MG tablet      3 tabs po day one, then 2 po daily x 4 days   11 tablet   0    BP 151/76  Pulse 110  Temp(Src) 100.9 F (38.3 C) (Oral)  Resp 20  Ht 6\' 2"  (1.88 m)  Wt 252 lb (114.306 kg)  BMI 32.34 kg/m2  SpO2 97% Physical Exam  Nursing note and vitals reviewed. Constitutional: He is oriented to person, place, and time. He appears well-developed and well-nourished.  HENT:  Head: Normocephalic and atraumatic.  Eyes: Conjunctivae and EOM are normal. Pupils are equal, round, and reactive to light.  Neck: Normal  range of motion. Neck supple.  Cardiovascular: Normal rate, regular rhythm and normal heart sounds.   Pulmonary/Chest: Effort normal.  Minimal expiratory wheeze bilaterally  Abdominal: Soft. Bowel sounds are normal.  Musculoskeletal: Normal range of motion.  Neurological: He is alert and oriented to person, place, and time.  Skin: Skin is warm and dry.  Psychiatric: He has a normal mood and affect. His behavior is normal.    ED Course  Procedures (including critical care time) Labs Review Labs Reviewed  CBC WITH DIFFERENTIAL - Abnormal; Notable for the following:    WBC 11.2 (*)    Neutrophils Relative % 80 (*)    Neutro Abs 9.0 (*)    Lymphocytes Relative 9 (*)    All other components within normal limits  BASIC METABOLIC PANEL - Abnormal; Notable for the following:    Glucose, Bld 106 (*)    GFR calc non Af Amer 77 (*)    GFR calc Af Amer 89 (*)    All other components within normal limits  TROPONIN I   Imaging Review Dg Chest 2 View  09/25/2013   CLINICAL DATA:  Cough and shortness of breath.  EXAM: CHEST  2 VIEW  COMPARISON:  CT chest 02/15/2012. Single view of the chest 02/14/2012.  FINDINGS: Heart size is upper normal. The lungs are clear without edema or consolidative process. No pneumothorax or pleural effusion.  IMPRESSION: No acute disease.   Electronically Signed   By: Drusilla Kanner M.D.   On: 09/25/2013 18:19    EKG Interpretation   None       MDM   1. URI (upper respiratory infection)    Patient is oxygenating well. Chest x-ray negative for pneumonia. History and physical consistent with viral syndrome. Discharge medications albuterol inhaler, prednisone, Tussionex cough syrup.    Donnetta Hutching, MD 09/25/13 2128

## 2013-09-25 NOTE — ED Notes (Signed)
Went in room to assess pt. Pt not in room.

## 2013-09-25 NOTE — Discharge Instructions (Signed)
Chest x-ray shows no pneumonia. Increase fluids. Tylenol.  Use your inhaler. Prescription for prednisone and cough syrup

## 2013-09-25 NOTE — ED Notes (Signed)
Pt reports flu-like symptoms since Friday, hard to catch his breath at times, +cough that is keeping him up all night, fever 101.0 at home today, taking ptc meds.

## 2015-08-05 ENCOUNTER — Encounter (HOSPITAL_COMMUNITY): Payer: Self-pay | Admitting: Emergency Medicine

## 2015-08-05 ENCOUNTER — Emergency Department (HOSPITAL_COMMUNITY): Payer: Medicare Other

## 2015-08-05 ENCOUNTER — Emergency Department (HOSPITAL_COMMUNITY)
Admission: EM | Admit: 2015-08-05 | Discharge: 2015-08-05 | Disposition: A | Discharge status: Home / Self Care | Payer: Medicare Other | Attending: Emergency Medicine | Admitting: Emergency Medicine

## 2015-08-05 DIAGNOSIS — Z880 Allergy status to penicillin: Secondary | ICD-10-CM | POA: Diagnosis not present

## 2015-08-05 DIAGNOSIS — Y998 Other external cause status: Secondary | ICD-10-CM | POA: Diagnosis not present

## 2015-08-05 DIAGNOSIS — Y929 Unspecified place or not applicable: Secondary | ICD-10-CM | POA: Diagnosis not present

## 2015-08-05 DIAGNOSIS — X58XXXA Exposure to other specified factors, initial encounter: Secondary | ICD-10-CM | POA: Insufficient documentation

## 2015-08-05 DIAGNOSIS — Z8639 Personal history of other endocrine, nutritional and metabolic disease: Secondary | ICD-10-CM | POA: Insufficient documentation

## 2015-08-05 DIAGNOSIS — Y939 Activity, unspecified: Secondary | ICD-10-CM | POA: Insufficient documentation

## 2015-08-05 DIAGNOSIS — Z87891 Personal history of nicotine dependence: Secondary | ICD-10-CM | POA: Diagnosis not present

## 2015-08-05 DIAGNOSIS — Z79899 Other long term (current) drug therapy: Secondary | ICD-10-CM | POA: Diagnosis not present

## 2015-08-05 DIAGNOSIS — J069 Acute upper respiratory infection, unspecified: Secondary | ICD-10-CM | POA: Insufficient documentation

## 2015-08-05 DIAGNOSIS — S29012A Strain of muscle and tendon of back wall of thorax, initial encounter: Secondary | ICD-10-CM | POA: Insufficient documentation

## 2015-08-05 DIAGNOSIS — I10 Essential (primary) hypertension: Secondary | ICD-10-CM | POA: Insufficient documentation

## 2015-08-05 DIAGNOSIS — S233XXA Sprain of ligaments of thoracic spine, initial encounter: Secondary | ICD-10-CM

## 2015-08-05 DIAGNOSIS — M109 Gout, unspecified: Secondary | ICD-10-CM | POA: Diagnosis not present

## 2015-08-05 DIAGNOSIS — S238XXA Sprain of other specified parts of thorax, initial encounter: Secondary | ICD-10-CM | POA: Diagnosis not present

## 2015-08-05 DIAGNOSIS — R05 Cough: Secondary | ICD-10-CM | POA: Diagnosis present

## 2015-08-05 MED ORDER — HYDROCOD POLST-CPM POLST ER 10-8 MG/5ML PO SUER: 5.0000 mL | Freq: Two times a day (BID) | ORAL | Status: DC | PRN

## 2015-08-05 MED ORDER — IBUPROFEN 600 MG PO TABS: 600.0000 mg | ORAL_TABLET | Freq: Four times a day (QID) | ORAL | Status: DC | PRN

## 2015-08-05 MED ORDER — METHOCARBAMOL 500 MG PO TABS: 500.0000 mg | ORAL_TABLET | Freq: Three times a day (TID) | ORAL | Status: DC

## 2015-08-05 NOTE — Discharge Instructions (Signed)
Your chest x-ray is negative for pneumonia, or extra fluid. Your examination favors a muscle related issue with your back and flank area. Warm tub soaks may be helpful. Please use Robaxin and ibuprofen for your back. Use Tussionex for her cough if needed. Tussionex and Robaxin may cause drowsiness, please use these medications with caution. Muscle Strain A muscle strain (pulled muscle) happens when a muscle is stretched beyond normal length. It happens when a sudden, violent force stretches your muscle too far. Usually, a few of the fibers in your muscle are torn. Muscle strain is common in athletes. Recovery usually takes 1-2 weeks. Complete healing takes 5-6 weeks.  HOME CARE   Follow the PRICE method of treatment to help your injury get better. Do this the first 2-3 days after the injury:  Protect. Protect the muscle to keep it from getting injured again.  Rest. Limit your activity and rest the injured body part.  Ice. Put ice in a plastic bag. Place a towel between your skin and the bag. Then, apply the ice and leave it on from 15-20 minutes each hour. After the third day, switch to moist heat packs.  Compression. Use a splint or elastic bandage on the injured area for comfort. Do not put it on too tightly.  Elevate. Keep the injured body part above the level of your heart.  Only take medicine as told by your doctor.  Warm up before doing exercise to prevent future muscle strains. GET HELP IF:   You have more pain or puffiness (swelling) in the injured area.  You feel numbness, tingling, or notice a loss of strength in the injured area. MAKE SURE YOU:   Understand these instructions.  Will watch your condition.  Will get help right away if you are not doing well or get worse.   This information is not intended to replace advice given to you by your health care provider. Make sure you discuss any questions you have with your health care provider.   Document Released: 06/02/2008  Document Revised: 06/14/2013 Document Reviewed: 03/23/2013 Elsevier Interactive Patient Education [employer]2016 [employer] Inc.

## 2015-08-05 NOTE — ED Notes (Signed)
Pt states that he has had a dry cough for the past several days.

## 2015-08-05 NOTE — ED Provider Notes (Signed)
CSN: [id number]     Arrival date & time 08/05/15  1620 History  By signing my name below, I, Gwenyth Oberatherine Macek, attest that this documentation has been prepared under the direction and in the presence of Ivery QualeHobson Armando Lauman, PA-C  Electronically Signed: Gwenyth Oberatherine Macek, ED Scribe. 08/05/2015. 5:00 PM.   Chief Complaint  Patient presents with  . Cough   Patient is a 64 y.o. male presenting with cough. The history is provided by the patient. No language interpreter was used.  Cough Cough characteristics:  Non-productive Severity:  Moderate Onset quality:  Gradual Duration:  2 weeks Timing:  Intermittent Progression:  Unchanged Chronicity:  New Associated symptoms: no fever     HPI Comments: Ian Roberson is a 64 y.o. male who presents to the Emergency Department complaining of intermittent, moderate nonproductive cough that started 2 weeks ago. Pt reports right-sided back pain that occurs with cough as an associated symptom. His pain become worse with turning. Pt has a history of bronchitis. He denies recent lung surgeries. Pt also denies fever.  Past Medical History  Diagnosis Date  . Hypertension   . Hyperlipidemia   . Gout    Past Surgical History  Procedure Laterality Date  . Knee surgery    . Neck surgery    . Foot surgery     History reviewed. No pertinent family history. Social History  Substance Use Topics  . Smoking status: Former Games developermoker  . Smokeless tobacco: None  . Alcohol Use: Yes     Comment: weekends   Review of Systems  Constitutional: Negative for fever.  Respiratory: Positive for cough.   Musculoskeletal: Positive for back pain.  All other systems reviewed and are negative.  Allergies  Penicillins  Home Medications   Prior to Admission medications   Medication Sig Start Date End Date Taking? Authorizing Provider  chlorpheniramine-HYDROcodone (TUSSIONEX PENNKINETIC ER) 10-8 MG/5ML LQCR Take 5 mLs by mouth every 12 (twelve) hours as needed for cough.  09/25/13   Donnetta HutchingBrian Cook, MD  famotidine (PEPCID) 20 MG tablet Take 1 tablet (20 mg total) by mouth 2 (two) times daily. 02/15/12 02/14/13  Vanetta MuldersScott Zackowski, MD  predniSONE (DELTASONE) 20 MG tablet 3 tabs po day one, then 2 po daily x 4 days 09/25/13   Donnetta HutchingBrian Cook, MD   BP 161/93 mmHg  Pulse 91  Temp(Src) 98 F (36.7 C) (Oral)  Resp 18  Ht 6\' 1"  (1.854 m)  Wt 238 lb (107.956 kg)  BMI 31.41 kg/m2  SpO2 100% Physical Exam  Constitutional: He appears well-developed and well-nourished. No distress.  HENT:  Head: Normocephalic and atraumatic.  Eyes: Conjunctivae and EOM are normal.  Neck: Neck supple. No tracheal deviation present.  Cardiovascular: Normal rate, regular rhythm and normal heart sounds.   Pulmonary/Chest: Effort normal and breath sounds normal. No respiratory distress. He has no wheezes. He has no rales.  Symmetrical rise and fall of the chest Lungs clear  Musculoskeletal:  Pain of the mid-thoracic and upper lumbar with ROM No palpable step-off of mid-thoracic or lumbar No hot areas  Skin: Skin is warm and dry.  Psychiatric: He has a normal mood and affect. His behavior is normal.  Nursing note and vitals reviewed.   ED Course  Procedures  DIAGNOSTIC STUDIES: Oxygen Saturation is 100% on RA, normal by my interpretation.    COORDINATION OF CARE: 5:03 PM Discussed x-ray results and suspicion for pain secondary to muscle strain. Discussed treatment plan with pt which includes muscle relaxant. He agreed to plan.  Labs Review Labs Reviewed - No data to display  Imaging Review Dg Chest 2 View  08/05/2015  CLINICAL DATA:  Cough for several days with right-sided chest pain EXAM: CHEST  2 VIEW COMPARISON:  September 25, 2013 FINDINGS: There is no edema or consolidation. The heart size and pulmonary vascularity are normal. No adenopathy. No bone lesions. No pneumothorax. IMPRESSION: No edema or consolidation. Electronically Signed   By: Bretta Bang III M.D.   On: 08/05/2015  16:53   I have personally reviewed and evaluated these images as part of my medical decision-making.   EKG Interpretation None      MDM  The x-ray is negative for edema or pneumonia or other acute problems. The vital signs are within normal limits with exception of the blood pressure being 161/93. The patient will be prescribed Robaxin and ibuprofen for muscle strain of the back and flank area. He'll be prescribed Tussionex for the cough.    Final diagnoses:  None    **I have reviewed nursing notes, vital signs, and all appropriate lab and imaging results for this patient.*  **I personally performed the services described in this documentation, which was scribed in my presence. The recorded information has been reviewed and is accurate.Ivery Quale, PA-C 08/05/15 1725  Zadie Rhine, MD 08/05/15 [phone]

## 2016-02-22 ENCOUNTER — Emergency Department (HOSPITAL_COMMUNITY)
Admission: EM | Admit: 2016-02-22 | Discharge: 2016-02-22 | Disposition: A | Discharge status: Home / Self Care | Payer: Medicare Other | Attending: Emergency Medicine | Admitting: Emergency Medicine

## 2016-02-22 ENCOUNTER — Encounter (HOSPITAL_COMMUNITY): Payer: Self-pay | Admitting: Emergency Medicine

## 2016-02-22 ENCOUNTER — Other Ambulatory Visit: Payer: Self-pay

## 2016-02-22 DIAGNOSIS — Z79891 Long term (current) use of opiate analgesic: Secondary | ICD-10-CM | POA: Insufficient documentation

## 2016-02-22 DIAGNOSIS — I10 Essential (primary) hypertension: Secondary | ICD-10-CM | POA: Diagnosis present

## 2016-02-22 DIAGNOSIS — Z87891 Personal history of nicotine dependence: Secondary | ICD-10-CM | POA: Diagnosis not present

## 2016-02-22 DIAGNOSIS — E785 Hyperlipidemia, unspecified: Secondary | ICD-10-CM | POA: Insufficient documentation

## 2016-02-22 DIAGNOSIS — Z79899 Other long term (current) drug therapy: Secondary | ICD-10-CM | POA: Diagnosis not present

## 2016-02-22 LAB — I-STAT CHEM 8, ED
BUN: 9 mg/dL (ref 6–20)
CREATININE: 0.8 mg/dL (ref 0.61–1.24)
Calcium, Ion: 1.14 mmol/L (ref 1.13–1.30)
Chloride: 102 mmol/L (ref 101–111)
Glucose, Bld: 121 mg/dL — ABNORMAL HIGH (ref 65–99)
HEMATOCRIT: 45 % (ref 39.0–52.0)
HEMOGLOBIN: 15.3 g/dL (ref 13.0–17.0)
POTASSIUM: 3.6 mmol/L (ref 3.5–5.1)
SODIUM: 137 mmol/L (ref 135–145)
TCO2: 22 mmol/L (ref 0–100)

## 2016-02-22 NOTE — ED Notes (Addendum)
Patient c/o hypertension. Per patient blood pressure 170/120 at home. Per patient took blood pressure because he was "feeling bad." Per patient some dizziness today. Patient states started to "fell bad at 6:30am." Denies weakness on certain side, facial drooping, headache, or slurred speech. Patient does report some swelling in ankles. Patient states that blood pressure medication was increased from half a tablet to whole tablet by PCP x1 week ago.

## 2016-02-22 NOTE — Discharge Instructions (Signed)
Hypertension Hypertension, commonly called high blood pressure, is when the force of blood pumping through your arteries is too strong. Your arteries are the blood vessels that carry blood from your heart throughout your body. A blood pressure reading consists of a higher number over a lower number, such as 110/72. The higher number (systolic) is the pressure inside your arteries when your heart pumps. The lower number (diastolic) is the pressure inside your arteries when your heart relaxes. Ideally you want your blood pressure below 120/80. Hypertension forces your heart to work harder to pump blood. Your arteries may become narrow or stiff. Having untreated or uncontrolled hypertension can cause heart attack, stroke, kidney disease, and other problems. RISK FACTORS Some risk factors for high blood pressure are controllable. Others are not.  Risk factors you cannot control include:   Race. You may be at higher risk if you are African American.  Age. Risk increases with age.  Gender. Men are at higher risk than women before age 45 years. After age 65, women are at higher risk than men. Risk factors you can control include:  Not getting enough exercise or physical activity.  Being overweight.  Getting too much fat, sugar, calories, or salt in your diet.  Drinking too much alcohol. SIGNS AND SYMPTOMS Hypertension does not usually cause signs or symptoms. Extremely high blood pressure (hypertensive crisis) may cause headache, anxiety, shortness of breath, and nosebleed. DIAGNOSIS To check if you have hypertension, your health care provider will measure your blood pressure while you are seated, with your arm held at the level of your heart. It should be measured at least twice using the same arm. Certain conditions can cause a difference in blood pressure between your right and left arms. A blood pressure reading that is higher than normal on one occasion does not mean that you need treatment. If  it is not clear whether you have high blood pressure, you may be asked to return on a different day to have your blood pressure checked again. Or, you may be asked to monitor your blood pressure at home for 1 or more weeks. TREATMENT Treating high blood pressure includes making lifestyle changes and possibly taking medicine. Living a healthy lifestyle can help lower high blood pressure. You may need to change some of your habits. Lifestyle changes may include:  Following the DASH diet. This diet is high in fruits, vegetables, and whole grains. It is low in salt, red meat, and added sugars.  Keep your sodium intake below 2,300 mg per day.  Getting at least 30-45 minutes of aerobic exercise at least 4 times per week.  Losing weight if necessary.  Not smoking.  Limiting alcoholic beverages.  Learning ways to reduce stress. Your health care provider may prescribe medicine if lifestyle changes are not enough to get your blood pressure under control, and if one of the following is true:  You are 18-59 years of age and your systolic blood pressure is above 140.  You are 60 years of age or older, and your systolic blood pressure is above 150.  Your diastolic blood pressure is above 90.  You have diabetes, and your systolic blood pressure is over 140 or your diastolic blood pressure is over 90.  You have kidney disease and your blood pressure is above 140/90.  You have heart disease and your blood pressure is above 140/90. Your personal target blood pressure may vary depending on your medical conditions, your age, and other factors. HOME CARE INSTRUCTIONS    Have your blood pressure rechecked as directed by your health care provider.   Take medicines only as directed by your health care provider. Follow the directions carefully. Blood pressure medicines must be taken as prescribed. The medicine does not work as well when you skip doses. Skipping doses also puts you at risk for  problems.  Do not smoke.   Monitor your blood pressure at home as directed by your health care provider. SEEK MEDICAL CARE IF:   You think you are having a reaction to medicines taken.  You have recurrent headaches or feel dizzy.  You have swelling in your ankles.  You have trouble with your vision. SEEK IMMEDIATE MEDICAL CARE IF:  You develop a severe headache or confusion.  You have unusual weakness, numbness, or feel faint.  You have severe chest or abdominal pain.  You vomit repeatedly.  You have trouble breathing. MAKE SURE YOU:   Understand these instructions.  Will watch your condition.  Will get help right away if you are not doing well or get worse.   This information is not intended to replace advice given to you by your health care provider. Make sure you discuss any questions you have with your health care provider.   Document Released: 08/24/2005 Document Revised: 01/08/2015 Document Reviewed: 06/16/2013 Elsevier Interactive Patient Education 2016 [employer]  

## 2016-02-22 NOTE — ED Provider Notes (Signed)
CSN: [id number]     Arrival date & time 02/22/16  1810 History   First MD Initiated Contact with Patient 02/22/16 1828     Chief Complaint  Patient presents with  . Hypertension     Patient is a 65 y.o. male presenting with hypertension.  Hypertension Pertinent negatives include no chest pain, no abdominal pain, no headaches and no shortness of breath.  Patient presents with high blood pressure. States his blood pressures to 20 systolic. States he checked it because he felt a little dizzy. No chest pain. No headache. States the dizziness was if he turned his head his vision got blurry. He is on blood pressure medicine. Has been on half a pill was now on a full pill for the last 2 weeks.  Past Medical History  Diagnosis Date  . Hypertension   . Hyperlipidemia   . Gout    Past Surgical History  Procedure Laterality Date  . Knee surgery    . Neck surgery    . Foot surgery     Family History  Problem Relation Age of Onset  . Heart attack Father   . Stroke Mother    Social History  Substance Use Topics  . Smoking status: Former Smoker    Types: Cigarettes  . Smokeless tobacco: Never Used  . Alcohol Use: Yes     Comment: weekends    Review of Systems  Constitutional: Negative for activity change and appetite change.  Eyes: Negative for pain.  Respiratory: Negative for chest tightness and shortness of breath.   Cardiovascular: Negative for chest pain and leg swelling.  Gastrointestinal: Negative for nausea, vomiting, abdominal pain and diarrhea.  Genitourinary: Negative for flank pain.  Musculoskeletal: Negative for back pain and neck stiffness.  Skin: Negative for rash.  Neurological: Positive for dizziness. Negative for weakness, numbness and headaches.  Psychiatric/Behavioral: Negative for behavioral problems.      Allergies  Penicillins  Home Medications   Prior to Admission medications   Medication Sig Start Date End Date Taking? Authorizing Provider   allopurinol (ZYLOPRIM) 100 MG tablet Take 100 mg by mouth daily.   Yes Historical Provider, MD  amlodipine-benazepril (LOTREL) 2.5-10 MG capsule Take 1 capsule by mouth daily.   Yes Historical Provider, MD  HYDROcodone-acetaminophen (NORCO) 10-325 MG tablet Take 1 tablet by mouth every 6 (six) hours as needed for moderate pain.   Yes Historical Provider, MD  Vitamin D, Ergocalciferol, (DRISDOL) 50000 units CAPS capsule Take 50,000 Units by mouth every 7 (seven) days. Takes on Sundays.   Yes Historical Provider, MD   BP 168/98 mmHg  Pulse 99  Temp(Src) 98.9 F (37.2 C) (Oral)  Resp 20  Ht  (1.88 m)  Wt 242 lb (109.77 kg)  BMI 31.06 kg/m2  SpO2 99% Physical Exam  Constitutional: He is oriented to person, place, and time. He appears well-developed and well-nourished.  HENT:  Head: Normocephalic and atraumatic.  Eyes: EOM are normal. Pupils are equal, round, and reactive to light.  Neck: Normal range of motion. Neck supple.  Cardiovascular: Normal rate, regular rhythm and normal heart sounds.   No murmur heard. Pulmonary/Chest: Effort normal and breath sounds normal.  Abdominal: Soft. Bowel sounds are normal. He exhibits no distension. There is no tenderness.  Musculoskeletal: Normal range of motion. He exhibits no edema.  Neurological: He is alert and oriented to person, place, and time. No cranial nerve deficit.  Skin: Skin is warm and dry.  Psychiatric: He has a normal mood  and affect.  Nursing note and vitals reviewed.   ED Course  Procedures (including critical care time) Labs Review Labs Reviewed  I-STAT CHEM 8, ED - Abnormal; Notable for the following:    Glucose, Bld 121 (*)    All other components within normal limits    Imaging Review No results found. I have personally reviewed and evaluated these images and lab results as part of my medical decision-making.   EKG Interpretation None     ED ECG REPORT   Date: 02/22/2016  Rate: 90  Rhythm: normal sinus  rhythm  QRS Axis: left  Intervals: normal  ST/T Wave abnormalities: normal  Conduction Disutrbances:none  Narrative Interpretation:   Old EKG Reviewed: unchanged   MDM   Final diagnoses:  Essential hypertension    Patient with hypertension. Does not appear to be and/or can damage. Blood pressures come down just with monitoring. He is on what sounds like a amlodipine hydrochlorothiazide combo. His primary care doctor will likely need to adjust his medicines.    Benjiman CoreNathan Syon Tews, MD 02/22/16 2218

## 2016-02-22 NOTE — ED Notes (Signed)
EDP at bedside  

## 2016-04-05 ENCOUNTER — Emergency Department (HOSPITAL_COMMUNITY)
Admission: EM | Admit: 2016-04-05 | Discharge: 2016-04-05 | Disposition: A | Discharge status: Home / Self Care | Payer: Medicare Other | Attending: Emergency Medicine | Admitting: Emergency Medicine

## 2016-04-05 ENCOUNTER — Encounter (HOSPITAL_COMMUNITY): Payer: Self-pay | Admitting: *Deleted

## 2016-04-05 DIAGNOSIS — I10 Essential (primary) hypertension: Secondary | ICD-10-CM | POA: Diagnosis not present

## 2016-04-05 DIAGNOSIS — Z87891 Personal history of nicotine dependence: Secondary | ICD-10-CM | POA: Insufficient documentation

## 2016-04-05 DIAGNOSIS — Z79899 Other long term (current) drug therapy: Secondary | ICD-10-CM | POA: Insufficient documentation

## 2016-04-05 DIAGNOSIS — R42 Dizziness and giddiness: Secondary | ICD-10-CM

## 2016-04-05 MED ORDER — CLONIDINE HCL 0.1 MG PO TABS
0.1000 mg | ORAL_TABLET | Freq: Once | ORAL | Status: AC
Start: 2016-04-05 — End: 2016-04-05
  Administered 2016-04-05: 0.1 mg via ORAL
  Filled 2016-04-05: qty 1

## 2016-04-05 NOTE — Discharge Instructions (Signed)
Please call your doctor's office to let them know about your ED visit tonight. Please start keeping a long of your blood pressure and write it down in the morning and in the evening so your doctor can look at it. Your doctor may want to adjust your blood pressure medication if your blood pressure is staying high. Return to the ED if you get a headache, chest pain, or have difficulty breathing.

## 2016-04-05 NOTE — ED Provider Notes (Signed)
[facility] Provider Note   CSN: [id number] Arrival date & time: 04/05/16  8295  First Provider Contact:  First MD Initiated Contact with Patient 04/05/16 0413        History   Chief Complaint Chief Complaint  Patient presents with  . Hypertension    HPI Ian Roberson is a 65 y.o. male.  HPI patient states he got up at 2:30 this morning to use the bathroom and when he stood up he felt "swimmy headed". He states that is the way he feels when his blood pressure is up. He states he has taken his blood pressure medication today which he normally takes every morning. He states his blood pressure is normally 128/84. He denies any headache, visual changes, nausea, vomiting, chest pain, but states he just have some mild shortness of breath. He states he normally uses an inhaler however the shortness of breath was not severe enough to use his inhaler this morning. He also states she's been having swelling of his ankles for the past 3-4 months. He denies any dietary indiscretion today such as a high salt food however he did drink one 25 ounces of beer tonight.    PCP [facility] in [place]  Past Medical History:  Diagnosis Date  . Gout   . Hyperlipidemia   . Hypertension     There are no active problems to display for this patient.   Past Surgical History:  Procedure Laterality Date  . FOOT SURGERY    . KNEE SURGERY    . NECK SURGERY         Home Medications   Albuterol inhaler  Prior to Admission medications   Medication Sig Start Date End Date Taking? Authorizing Provider  allopurinol (ZYLOPRIM) 100 MG tablet Take 100 mg by mouth daily.   Yes Historical Provider, MD  amlodipine-benazepril (LOTREL) 2.5-10 MG capsule Take 1 capsule by mouth daily.   Yes Historical Provider, MD  HYDROcodone-acetaminophen (NORCO) 10-325 MG tablet Take 1 tablet by mouth every 6 (six) hours as needed for moderate pain.   Yes Historical Provider, MD  Vitamin D, Ergocalciferol, (DRISDOL)  50000 units CAPS capsule Take 50,000 Units by mouth every 7 (seven) days. Takes on Sundays.   Yes Historical Provider, MD    Family History Family History  Problem Relation Age of Onset  . Heart attack Father   . Stroke Mother     Social History Social History  Substance Use Topics  . Smoking status: Former Smoker    Types: Cigarettes  . Smokeless tobacco: Never Used  . Alcohol use Yes     Comment: weekends  On disability for his back and knees   Allergies   Penicillins   Review of Systems Review of Systems  All other systems reviewed and are negative.    Physical Exam Updated Vital Signs  ED Triage Vitals [04/05/16 0406]  Enc Vitals Group     BP (!) 168/101     Pulse Rate 82     Resp 20     Temp 98.1 F (36.7 C)     Temp src      SpO2 98 %     Weight 238 lb (108 kg)     Height  (1.88 m)     Head Circumference      Peak Flow      Pain Score      Pain Loc      Pain Edu?      Excl. in GC?  Vital signs normal Except for hypertension    Physical Exam  Constitutional: He is oriented to person, place, and time. He appears well-developed and well-nourished.  Non-toxic appearance. He does not appear ill. No distress.  HENT:  Head: Normocephalic and atraumatic.  Right Ear: External ear normal.  Left Ear: External ear normal.  Nose: Nose normal. No mucosal edema or rhinorrhea.  Mouth/Throat: Oropharynx is clear and moist and mucous membranes are normal. No dental abscesses or uvula swelling.  Eyes: Conjunctivae and EOM are normal. Pupils are equal, round, and reactive to light.  Neck: Normal range of motion and full passive range of motion without pain. Neck supple.  Cardiovascular: Normal rate, regular rhythm and normal heart sounds.  Exam reveals no gallop and no friction rub.   No murmur heard. Pulmonary/Chest: Effort normal and breath sounds normal. No respiratory distress. He has no wheezes. He has no rhonchi. He has no rales. He exhibits no  tenderness and no crepitus.  Abdominal: Soft. Normal appearance and bowel sounds are normal. He exhibits no distension. There is no tenderness. There is no rebound and no guarding.  Musculoskeletal: Normal range of motion. He exhibits no edema or tenderness.  Moves all extremities well.   Neurological: He is alert and oriented to person, place, and time. He has normal strength. No cranial nerve deficit.  Skin: Skin is warm, dry and intact. No rash noted. No erythema. No pallor.  Psychiatric: He has a normal mood and affect. His speech is normal and behavior is normal. His mood appears not anxious.  Nursing note and vitals reviewed.    ED Treatments / Results  Labs (all labs ordered are listed, but only abnormal results are displayed) Labs Reviewed - No data to display  EKG  EKG Interpretation  Date/Time:  Sunday April 05 2016 04:10:52 EDT Ventricular Rate:  86 PR Interval:    QRS Duration: 90 QT Interval:  384 QTC Calculation: 460 R Axis:   -26 Text Interpretation:  Sinus rhythm Borderline left axis deviation No significant change since last tracing 22 Feb 2016 Confirmed by Tiara Bartoli  MD-I, Tesha Archambeau ([id number]) on 04/05/2016 4:13:48 AM       Radiology No results found.  Procedures Procedures (including critical care time)  Medications Ordered in ED Medications  cloNIDine (CATAPRES) tablet 0.1 mg (0.1 mg Oral Given 04/05/16 0456)     Initial Impression / Assessment and Plan / ED Course  I have reviewed the triage vital signs and the nursing notes.  Pertinent labs & imaging results that were available during my care of the patient were reviewed by me and considered in my medical decision making (see chart for details).  Clinical Course   Patient's blood pressure was 168/101 during my exam. He was given clonidine 0.1 mg orally.   Patient's blood pressure improved to 127/89 one  hour after getting the clonidine. We discussed keeping a blood pressure log so he can show his doctor. He  should call his doctor's office to let them know about his ED visit tonight. He should return to the ED if he gets a headache, chest pain, or trouble breathing.  Final Clinical Impressions(s) / ED Diagnoses   Final diagnoses:  Dizziness  Essential hypertension    Plan discharge  Devoria Albe, MD, Concha Pyo, MD 04/05/16 [phone]

## 2016-04-05 NOTE — ED Triage Notes (Signed)
Pt states that he got up to use the restroom at 2:30 and noticed that he was "[employer]", pt states that when he usually gets that way his blood pressure is elevated,

## 2016-04-20 ENCOUNTER — Encounter (HOSPITAL_COMMUNITY): Payer: Self-pay | Admitting: Emergency Medicine

## 2016-04-20 ENCOUNTER — Emergency Department (HOSPITAL_COMMUNITY)
Admission: EM | Admit: 2016-04-20 | Discharge: 2016-04-20 | Disposition: A | Discharge status: Home / Self Care | Payer: Medicare Other | Attending: Emergency Medicine | Admitting: Emergency Medicine

## 2016-04-20 ENCOUNTER — Emergency Department (HOSPITAL_COMMUNITY): Payer: Medicare Other

## 2016-04-20 DIAGNOSIS — I10 Essential (primary) hypertension: Secondary | ICD-10-CM | POA: Insufficient documentation

## 2016-04-20 DIAGNOSIS — M7989 Other specified soft tissue disorders: Secondary | ICD-10-CM | POA: Diagnosis not present

## 2016-04-20 DIAGNOSIS — R0602 Shortness of breath: Secondary | ICD-10-CM | POA: Diagnosis present

## 2016-04-20 DIAGNOSIS — Z79899 Other long term (current) drug therapy: Secondary | ICD-10-CM | POA: Diagnosis not present

## 2016-04-20 DIAGNOSIS — Z87891 Personal history of nicotine dependence: Secondary | ICD-10-CM | POA: Insufficient documentation

## 2016-04-20 DIAGNOSIS — R42 Dizziness and giddiness: Secondary | ICD-10-CM | POA: Insufficient documentation

## 2016-04-20 LAB — CBC WITH DIFFERENTIAL/PLATELET
BASOS PCT: 0 %
Basophils Absolute: 0 10*3/uL (ref 0.0–0.1)
EOS ABS: 0.1 10*3/uL (ref 0.0–0.7)
Eosinophils Relative: 1 %
HCT: 42.3 % (ref 39.0–52.0)
HEMOGLOBIN: 14.4 g/dL (ref 13.0–17.0)
Lymphocytes Relative: 21 %
Lymphs Abs: 1.9 10*3/uL (ref 0.7–4.0)
MCH: 28.2 pg (ref 26.0–34.0)
MCHC: 34 g/dL (ref 30.0–36.0)
MCV: 82.9 fL (ref 78.0–100.0)
MONO ABS: 0.3 10*3/uL (ref 0.1–1.0)
MONOS PCT: 4 %
NEUTROS PCT: 74 %
Neutro Abs: 6.7 10*3/uL (ref 1.7–7.7)
Platelets: 221 10*3/uL (ref 150–400)
RBC: 5.1 MIL/uL (ref 4.22–5.81)
RDW: 13.4 % (ref 11.5–15.5)
WBC: 9.1 10*3/uL (ref 4.0–10.5)

## 2016-04-20 LAB — COMPREHENSIVE METABOLIC PANEL
ALBUMIN: 4.4 g/dL (ref 3.5–5.0)
ALT: 35 U/L (ref 17–63)
ANION GAP: 9 (ref 5–15)
AST: 45 U/L — ABNORMAL HIGH (ref 15–41)
Alkaline Phosphatase: 57 U/L (ref 38–126)
BUN: 9 mg/dL (ref 6–20)
CHLORIDE: 101 mmol/L (ref 101–111)
CO2: 24 mmol/L (ref 22–32)
Calcium: 9.4 mg/dL (ref 8.9–10.3)
Creatinine, Ser: 0.83 mg/dL (ref 0.61–1.24)
GFR calc Af Amer: >60 mL/min (ref >60)
GFR calc non Af Amer: >60 mL/min (ref >60)
GLUCOSE: 125 mg/dL — AB (ref 65–99)
POTASSIUM: 3.7 mmol/L (ref 3.5–5.1)
SODIUM: 134 mmol/L — AB (ref 135–145)
Total Bilirubin: 0.8 mg/dL (ref 0.3–1.2)
Total Protein: 7.9 g/dL (ref 6.5–8.1)

## 2016-04-20 LAB — BRAIN NATRIURETIC PEPTIDE: B Natriuretic Peptide: 16 pg/mL (ref 0.0–100.0)

## 2016-04-20 LAB — TROPONIN I: Troponin I: <0.03 ng/mL (ref <0.03)

## 2016-04-20 LAB — D-DIMER, QUANTITATIVE (NOT AT ARMC): D DIMER QUANT: 1.09 ug{FEU}/mL — AB (ref 0.00–0.50)

## 2016-04-20 MED ORDER — IOPAMIDOL (ISOVUE-370) INJECTION 76%
100.0000 mL | Freq: Once | INTRAVENOUS | Status: AC | PRN
  Administered 2016-04-20: 100 mL via INTRAVENOUS

## 2016-04-20 NOTE — ED Provider Notes (Signed)
[facility] Provider Note   CSN: [id number] Arrival date & time: 04/20/16  1112  By signing my name below, I, Placido Sou, attest that this documentation has been prepared under the direction and in the presence of Glynn Octave, MD. Electronically Signed: Placido Sou, ED Scribe. 04/20/16. 12:57 PM.   History   Chief Complaint Chief Complaint  Patient presents with  . Shortness of Breath    HPI HPI Comments: Ian Roberson is a 65 y.o. male who presents to the Emergency Department complaining of mild SOB onset this morning. Pt states that he woke this morning and was having difficulty taking deep breaths, mild lightheadedness which he describes as "my head feeling swimmy" and reports mild, worsening, bilateral leg swelling which began a few days ago. He checked his BP which was 160/100 mmHg noting his baseline BP is 120/80 mmHg. His SOB is present at rest and worsens with exertion. He states he is compliant with his HTN medications. He reports a hx of bronchitis 2 years ago but denies a PMHx of COPD or asthma. Pt denies a PMHx of DVT/PE , any recent travel or recent periods of increased immobilization. He denies cough, fever, CP, HA, urinary changes, visual changes, constipation and changes in appetite.    The history is provided by the patient. No language interpreter was used.    Past Medical History:  Diagnosis Date  . Gout   . Hyperlipidemia   . Hypertension     There are no active problems to display for this patient.   Past Surgical History:  Procedure Laterality Date  . FOOT SURGERY    . KNEE SURGERY    . NECK SURGERY         Home Medications    Prior to Admission medications   Medication Sig Start Date End Date Taking? Authorizing Provider  allopurinol (ZYLOPRIM) 100 MG tablet Take 100 mg by mouth daily.    Historical Provider, MD  amlodipine-benazepril (LOTREL) 2.5-10 MG capsule Take 1 capsule by mouth daily.    Historical Provider, MD    HYDROcodone-acetaminophen (NORCO) 10-325 MG tablet Take 1 tablet by mouth every 6 (six) hours as needed for moderate pain.    Historical Provider, MD  Vitamin D, Ergocalciferol, (DRISDOL) 50000 units CAPS capsule Take 50,000 Units by mouth every 7 (seven) days. Takes on Sundays.    Historical Provider, MD    Family History Family History  Problem Relation Age of Onset  . Heart attack Father   . Stroke Mother     Social History Social History  Substance Use Topics  . Smoking status: Former Smoker    Types: Cigarettes  . Smokeless tobacco: Never Used  . Alcohol use Yes     Comment: weekends     Allergies   Penicillins   Review of Systems Review of Systems  Constitutional: Negative for appetite change and fever.  Eyes: Negative for visual disturbance.  Respiratory: Positive for shortness of breath. Negative for cough.   Cardiovascular: Positive for leg swelling.  Gastrointestinal: Negative for constipation.  Genitourinary: Negative for difficulty urinating and urgency.  Neurological: Positive for light-headedness. Negative for headaches.  All other systems reviewed and are negative.  Physical Exam Updated Vital Signs BP 171/93 (BP Location: Right Arm)   Pulse 103   Temp 98.7 F (37.1 C) (Oral)   Resp 20   Ht 6\' 2"  (1.88 m)   Wt 235 lb (106.6 kg)   SpO2 98%   BMI 30.17 kg/m   Physical  Exam  Constitutional: He is oriented to person, place, and time. He appears well-developed and well-nourished. No distress.  Pt well appearing and in NAD  HENT:  Head: Normocephalic and atraumatic.  Mouth/Throat: Oropharynx is clear and moist. No oropharyngeal exudate.  Eyes: Conjunctivae and EOM are normal. Pupils are equal, round, and reactive to light.  Neck: Normal range of motion. Neck supple.  No meningismus.  Cardiovascular: Normal rate, regular rhythm, normal heart sounds and intact distal pulses.   No murmur heard. Pulmonary/Chest: Effort normal and breath sounds  normal. No respiratory distress. He has no wheezes.  Abdominal: Soft. There is no tenderness. There is no rebound and no guarding.  Musculoskeletal: Normal range of motion. He exhibits edema. He exhibits no tenderness.  Trace pedal edema noted bilaterally   Neurological: He is alert and oriented to person, place, and time. No cranial nerve deficit. He exhibits normal muscle tone. Coordination normal.  5/5 strength throughout. CN 2-12 intact.Equal grip strength.   Skin: Skin is warm.  Psychiatric: He has a normal mood and affect. His behavior is normal.  Nursing note and vitals reviewed.   ED Treatments / Results  Labs (all labs ordered are listed, but only abnormal results are displayed) Labs Reviewed  COMPREHENSIVE METABOLIC PANEL - Abnormal; Notable for the following:       Result Value   Sodium 134 (*)    Glucose, Bld 125 (*)    AST 45 (*)    All other components within normal limits  D-DIMER, QUANTITATIVE (NOT AT [facility]) - Abnormal; Notable for the following:    D-Dimer, Quant 1.09 (*)    All other components within normal limits  CBC WITH DIFFERENTIAL/PLATELET  BRAIN NATRIURETIC PEPTIDE  TROPONIN I  TROPONIN I    EKG  EKG Interpretation  Date/Time:  Monday April 20 2016 11:29:20 EDT Ventricular Rate:  98 PR Interval:  190 QRS Duration: 106 QT Interval:  364 QTC Calculation: 464 R Axis:   -39 Text Interpretation:  Normal sinus rhythm Left axis deviation Cannot rule out Anterior infarct , age undetermined Abnormal ECG No significant change was found Confirmed by Manus GunningANCOUR  MD, Matteo Banke [phone]([id number]) on 04/20/2016 12:58:07 PM       Radiology Dg Chest 2 View  Result Date: 04/20/2016 CLINICAL DATA:  Acute onset shortness of breath this morning. Hypertension. EXAM: CHEST  2 VIEW COMPARISON:  08/05/2015 FINDINGS: The heart size and mediastinal contours are within normal limits. Both lungs are clear. The visualized skeletal structures are unremarkable. IMPRESSION: Stable exam.  No  active cardiopulmonary disease. Electronically Signed   By: Myles RosenthalJohn  Stahl M.D.   On: 04/20/2016 11:55    Procedures Procedures  DIAGNOSTIC STUDIES: Oxygen Saturation is 98% on RA, normal by my interpretation.    COORDINATION OF CARE: 12:52 PM Discussed next steps with pt. Pt verbalized understanding and is agreeable with the plan.    Medications Ordered in ED Medications - No data to display   Initial Impression / Assessment and Plan / ED Course  I have reviewed the triage vital signs and the nursing notes.  Pertinent labs & imaging results that were available during my care of the patient were reviewed by me and considered in my medical decision making (see chart for details).  Clinical Course   Acute onset of shortness of breath that woke her from sleep this morning. No chest pain, cough or fever. His blood pressure has been higher than usual he's been having some lightheadedness.  Lungs Are clear. EKG  is normal sinus rhythm without acute change. Chest x-rays negative HEART score 2. Patient in no distress. Lungs are clear. No evidence of CHF exacerbation. BMP normal, lungs are clear. D-dimer elevated we'll obtain CT to rule out pulmonary embolism.  CT negative for pulmonary embolism other abnormality. He is ambulatory without desaturation. Continues to deny chest pain.  No evidence of pulmonary embolism, pneumonia, heart failure. We'll obtain second troponin. We'll need cardiology follow-up for stress test.  Second troponin negative. BP improved.  Followup with cardiology for stress test and echo. Return precautions discussed.  I personally performed the services described in this documentation, which was scribed in my presence. The recorded information has been reviewed and is accurate.  Final Clinical Impressions(s) / ED Diagnoses   Final diagnoses:  SOB (shortness of breath)    New Prescriptions New Prescriptions   No medications on file     Glynn OctaveStephen Baleria Wyman,  MD 04/20/16 1711

## 2016-04-20 NOTE — ED Triage Notes (Signed)
PT c/o SOB on exertion starting this am and states it feels like he can't take a deep breath. PT denies any cough but states some increased bilateral peripheral edema the past few days. PT denies any CP.

## 2016-04-20 NOTE — ED Notes (Signed)
Pt ambulated with no difficulties.  Pulse ox 99% on room air while ambulating.

## 2016-04-20 NOTE — Discharge Instructions (Signed)
Follow up with the cardiologist for a stress test. Return to the ED if you develop chest pain, shortness of breath, or any other concerns.

## 2016-05-10 ENCOUNTER — Inpatient Hospital Stay (HOSPITAL_COMMUNITY)
Admission: EM | Admit: 2016-05-10 | Discharge: 2016-05-15 | DRG: 872 | Disposition: A | Discharge status: Home / Self Care | Payer: Medicare Other | Attending: Family Medicine | Admitting: Family Medicine

## 2016-05-10 ENCOUNTER — Emergency Department (HOSPITAL_COMMUNITY): Payer: Medicare Other

## 2016-05-10 ENCOUNTER — Encounter (HOSPITAL_COMMUNITY): Payer: Self-pay | Admitting: *Deleted

## 2016-05-10 DIAGNOSIS — A419 Sepsis, unspecified organism: Secondary | ICD-10-CM | POA: Diagnosis not present

## 2016-05-10 DIAGNOSIS — R509 Fever, unspecified: Secondary | ICD-10-CM | POA: Diagnosis present

## 2016-05-10 DIAGNOSIS — R0682 Tachypnea, not elsewhere classified: Secondary | ICD-10-CM | POA: Diagnosis present

## 2016-05-10 DIAGNOSIS — R5381 Other malaise: Secondary | ICD-10-CM | POA: Diagnosis present

## 2016-05-10 DIAGNOSIS — Z87891 Personal history of nicotine dependence: Secondary | ICD-10-CM

## 2016-05-10 DIAGNOSIS — Z8249 Family history of ischemic heart disease and other diseases of the circulatory system: Secondary | ICD-10-CM

## 2016-05-10 DIAGNOSIS — I10 Essential (primary) hypertension: Secondary | ICD-10-CM | POA: Diagnosis present

## 2016-05-10 DIAGNOSIS — R0602 Shortness of breath: Secondary | ICD-10-CM

## 2016-05-10 DIAGNOSIS — E872 Acidosis: Secondary | ICD-10-CM | POA: Diagnosis present

## 2016-05-10 DIAGNOSIS — M109 Gout, unspecified: Secondary | ICD-10-CM | POA: Diagnosis present

## 2016-05-10 DIAGNOSIS — E876 Hypokalemia: Secondary | ICD-10-CM | POA: Diagnosis present

## 2016-05-10 DIAGNOSIS — Z823 Family history of stroke: Secondary | ICD-10-CM

## 2016-05-10 DIAGNOSIS — E785 Hyperlipidemia, unspecified: Secondary | ICD-10-CM | POA: Diagnosis present

## 2016-05-10 LAB — CBC WITH DIFFERENTIAL/PLATELET
BASOS PCT: 0 %
Basophils Absolute: 0 10*3/uL (ref 0.0–0.1)
EOS ABS: 0 10*3/uL (ref 0.0–0.7)
EOS PCT: 0 %
HCT: 39.4 % (ref 39.0–52.0)
Hemoglobin: 13.6 g/dL (ref 13.0–17.0)
LYMPHS ABS: 1.1 10*3/uL (ref 0.7–4.0)
Lymphocytes Relative: 11 %
MCH: 28.6 pg (ref 26.0–34.0)
MCHC: 34.5 g/dL (ref 30.0–36.0)
MCV: 82.9 fL (ref 78.0–100.0)
MONO ABS: 0.7 10*3/uL (ref 0.1–1.0)
MONOS PCT: 8 %
Neutro Abs: 7.7 10*3/uL (ref 1.7–7.7)
Neutrophils Relative %: 81 %
PLATELETS: 193 10*3/uL (ref 150–400)
RBC: 4.75 MIL/uL (ref 4.22–5.81)
RDW: 13.4 % (ref 11.5–15.5)
WBC: 9.5 10*3/uL (ref 4.0–10.5)

## 2016-05-10 LAB — I-STAT CG4 LACTIC ACID, ED: Lactic Acid, Venous: 3.14 mmol/L (ref 0.5–1.9)

## 2016-05-10 MED ORDER — VANCOMYCIN HCL IN DEXTROSE 1-5 GM/200ML-% IV SOLN
1000.0000 mg | INTRAVENOUS | Status: AC
  Administered 2016-05-10 – 2016-05-11 (×2): 1000 mg via INTRAVENOUS
  Filled 2016-05-10 (×2): qty 200

## 2016-05-10 MED ORDER — SODIUM CHLORIDE 0.9 % IV BOLUS (SEPSIS)
1000.0000 mL | Freq: Once | INTRAVENOUS | Status: AC
  Administered 2016-05-11: 1000 mL via INTRAVENOUS

## 2016-05-10 MED ORDER — SODIUM CHLORIDE 0.9 % IV BOLUS (SEPSIS)
1000.0000 mL | Freq: Once | INTRAVENOUS | Status: AC
  Administered 2016-05-10: 1000 mL via INTRAVENOUS

## 2016-05-10 MED ORDER — VANCOMYCIN HCL IN DEXTROSE 1-5 GM/200ML-% IV SOLN: 1000.0000 mg | Freq: Once | INTRAVENOUS | Status: DC

## 2016-05-10 MED ORDER — PIPERACILLIN-TAZOBACTAM 3.375 G IVPB 30 MIN
3.3750 g | Freq: Once | INTRAVENOUS | Status: AC
  Administered 2016-05-10: 3.375 g via INTRAVENOUS
  Filled 2016-05-10: qty 50

## 2016-05-10 MED ORDER — ACETAMINOPHEN 500 MG PO TABS
1000.0000 mg | ORAL_TABLET | Freq: Once | ORAL | Status: AC
  Administered 2016-05-10: 1000 mg via ORAL
  Filled 2016-05-10: qty 2

## 2016-05-10 MED ORDER — SODIUM CHLORIDE 0.9 % IV BOLUS (SEPSIS): 500.0000 mL | Freq: Once | INTRAVENOUS | Status: DC

## 2016-05-10 MED ORDER — ACETAMINOPHEN 325 MG PO TABS
650.0000 mg | ORAL_TABLET | Freq: Once | ORAL | Status: AC
  Administered 2016-05-10: 650 mg via ORAL
  Filled 2016-05-10: qty 2

## 2016-05-10 MED ORDER — ALBUTEROL SULFATE (2.5 MG/3ML) 0.083% IN NEBU
5.0000 mg | INHALATION_SOLUTION | Freq: Once | RESPIRATORY_TRACT | Status: AC
  Administered 2016-05-10: 5 mg via RESPIRATORY_TRACT
  Filled 2016-05-10: qty 6

## 2016-05-10 NOTE — ED Triage Notes (Signed)
Pt c/o sob, headache and elevated hr that started today,

## 2016-05-10 NOTE — ED Provider Notes (Signed)
[facility]Y DEPT Provider Note   CSN: [id number] Arrival date & time: 05/10/16  2114     History   Chief Complaint Chief Complaint  Patient presents with  . Shortness of Breath    HPI Ian Roberson is a 65 y.o. male.  Patient presents to the ED with a chief complaint of SOB.  He states that he noticed the SOB earlier today.  It is worsened with exertion.  He denies any CP or radiating pain.  Denies nausea, vomiting, or diarrhea.  Denies abdominal pain or dysuria.  Has not taken anything for the symptoms.  There are no other associated symptoms.  He was seen recently for the same and had a fairly extensive workup including CT PE study which was negative.  It is also noted that the patient is febrile in the ED.  He states that he has some frontal headache, but denies any neck pain.   The history is provided by the patient. No language interpreter was used.    Past Medical History:  Diagnosis Date  . Gout   . Hyperlipidemia   . Hypertension     There are no active problems to display for this patient.   Past Surgical History:  Procedure Laterality Date  . FOOT SURGERY    . KNEE SURGERY    . NECK SURGERY         Home Medications    Prior to Admission medications   Medication Sig Start Date End Date Taking? Authorizing Provider  allopurinol (ZYLOPRIM) 100 MG tablet Take 100 mg by mouth daily.    Historical Provider, MD  amLODipine (NORVASC) 10 MG tablet Take 10 mg by mouth daily.    Historical Provider, MD  Cholecalciferol (VITAMIN D) 2000 units CAPS Take 1 capsule by mouth daily.    Historical Provider, MD  hydrochlorothiazide (HYDRODIURIL) 25 MG tablet Take 25 mg by mouth daily.    Historical Provider, MD  HYDROcodone-acetaminophen (NORCO) 10-325 MG tablet Take 1 tablet by mouth every 6 (six) hours as needed for moderate pain.    Historical Provider, MD    Family History Family History  Problem Relation Age of Onset  . Heart attack Father   . Stroke Mother      Social History Social History  Substance Use Topics  . Smoking status: Former Smoker    Types: Cigarettes  . Smokeless tobacco: Never Used  . Alcohol use Yes     Comment: weekends     Allergies   Penicillins   Review of Systems Review of Systems  All other systems reviewed and are negative.    Physical Exam Updated Vital Signs BP 171/90 (BP Location: Left Arm)   Pulse (!) 126   Temp 101.2 F (38.4 C) (Oral)   Resp 20   Ht 6\' 2"  (1.88 m)   Wt 108 kg   SpO2 99%   BMI 30.56 kg/m   Physical Exam  Constitutional: He is oriented to person, place, and time. He appears well-developed and well-nourished.  HENT:  Head: Normocephalic and atraumatic.  Eyes: Conjunctivae and EOM are normal. Pupils are equal, round, and reactive to light. Right eye exhibits no discharge. Left eye exhibits no discharge. No scleral icterus.  Neck: Normal range of motion. Neck supple. No JVD present.  No meningismus  Cardiovascular: Regular rhythm and normal heart sounds.  Exam reveals no gallop and no friction rub.   No murmur heard. tachycardic  Pulmonary/Chest: Effort normal and breath sounds normal. No respiratory distress. He  has no wheezes. He has no rales. He exhibits no tenderness.  CTAB  Abdominal: Soft. He exhibits no distension and no mass. There is no tenderness. There is no rebound and no guarding.  No focal abdominal tenderness, no RLQ tenderness or pain at McBurney's point, no RUQ tenderness or Murphy's sign, no left-sided abdominal tenderness, no fluid wave, or signs of peritonitis   Musculoskeletal: Normal range of motion. He exhibits no edema or tenderness.  Neurological: He is alert and oriented to person, place, and time.  Skin: Skin is warm and dry.  Psychiatric: He has a normal mood and affect. His behavior is normal. Judgment and thought content normal.  Nursing note and vitals reviewed.    ED Treatments / Results  Labs (all labs ordered are listed, but only  abnormal results are displayed) Labs Reviewed  CULTURE, BLOOD (ROUTINE X 2)  CULTURE, BLOOD (ROUTINE X 2)  URINE CULTURE  URINALYSIS, ROUTINE W REFLEX MICROSCOPIC (NOT AT [place])  CBC WITH DIFFERENTIAL/PLATELET  COMPREHENSIVE METABOLIC PANEL  BRAIN NATRIURETIC PEPTIDE  TROPONIN I  I-STAT CG4 LACTIC ACID, ED    EKG  EKG Interpretation  Date/Time:  Sunday May 10 2016 21:23:27 EDT Ventricular Rate:  116 PR Interval:  172 QRS Duration: 110 QT Interval:  320 QTC Calculation: 444 R Axis:   -44 Text Interpretation:  Sinus tachycardia Left axis deviation Left ventricular hypertrophy with repolarization abnormality Abnormal ECG No significant change was found Confirmed by Manus Gunning  MD, STEPHEN [phone]) on 05/10/2016 9:54:18 PM       Radiology Dg Chest 2 View  Result Date: 05/10/2016 CLINICAL DATA:  Initial evaluation for acute shortness of breath. EXAM: CHEST  2 VIEW COMPARISON:  Prior CT and radiograph from 04/20/2016. FINDINGS: The cardiac and mediastinal silhouettes are stable in size and contour, and remain within normal limits. The lungs are normally inflated. No airspace consolidation, pleural effusion, or pulmonary edema is identified. There is no pneumothorax. No acute osseous abnormality identified. IMPRESSION: No active cardiopulmonary disease. Electronically Signed   By: Rise Mu M.D.   On: 05/10/2016 21:58    Procedures Procedures (including critical care time)  Medications Ordered in ED Medications  albuterol (PROVENTIL) (2.5 MG/3ML) 0.083% nebulizer solution 5 mg (not administered)  sodium chloride 0.9 % bolus 1,000 mL (not administered)    And  sodium chloride 0.9 % bolus 1,000 mL (not administered)    And  sodium chloride 0.9 % bolus 1,000 mL (not administered)    And  sodium chloride 0.9 % bolus 500 mL (not administered)  piperacillin-tazobactam (ZOSYN) IVPB 3.375 g (not administered)  vancomycin (VANCOCIN) IVPB 1000 mg/200 mL premix (not  administered)  acetaminophen (TYLENOL) tablet 650 mg (650 mg Oral Given 05/10/16 2127)     Initial Impression / Assessment and Plan / ED Course  I have reviewed the triage vital signs and the nursing notes.  Pertinent labs & imaging results that were available during my care of the patient were reviewed by me and considered in my medical decision making (see chart for details).  Clinical Course    Patient with SOB and body aches.  Symptoms started today.  Meets SIRS criteria.  Will start weight based fluids, no CHF.  Will give empiric abx.  Lactate is 3.14.  CXR is clear.  Will check flu.  No neck stiffness or meningismus.  Doubt meningitis.   Patient seen by and discussed with Dr. Manus Gunning, who agrees with plan for admission.  12:21 AM Discussed with Dr. Onalee Hua, who  recommends CT PE study.  Flu is pending.  Empiric antibiotics are running.  Dr. Onalee Hua to follow-up on CT PE study.  Appreciate Dr. Onalee Hua for admitting the patient.   Medications  sodium chloride 0.9 % bolus 1,000 mL (1,000 mLs Intravenous New Bag/Given 05/10/16 2247)    And  sodium chloride 0.9 % bolus 1,000 mL (not administered)    And  sodium chloride 0.9 % bolus 1,000 mL (1,000 mLs Intravenous New Bag/Given 05/10/16 2242)    And  sodium chloride 0.9 % bolus 500 mL (not administered)  vancomycin (VANCOCIN) IVPB 1000 mg/200 mL premix (1,000 mg Intravenous New Bag/Given 05/10/16 2244)  albuterol (PROVENTIL) (2.5 MG/3ML) 0.083% nebulizer solution 5 mg (5 mg Nebulization Given 05/10/16 2232)  acetaminophen (TYLENOL) tablet 650 mg (650 mg Oral Given 05/10/16 2127)  piperacillin-tazobactam (ZOSYN) IVPB 3.375 g (0 g Intravenous Stopped 05/10/16 2322)  acetaminophen (TYLENOL) tablet 1,000 mg (1,000 mg Oral Given 05/10/16 2243)     CRITICAL CARE Performed by: Roxy Horseman   Total critical care time: 35 minutes  Critical care time was exclusive of separately billable procedures and treating other patients.  Critical care was  necessary to treat or prevent imminent or life-threatening deterioration.  Critical care was time spent personally by me on the following activities: development of treatment plan with patient and/or surrogate as well as nursing, discussions with consultants, evaluation of patient's response to treatment, examination of patient, obtaining history from patient or surrogate, ordering and performing treatments and interventions, ordering and review of laboratory studies, ordering and review of radiographic studies, pulse oximetry and re-evaluation of patient's condition.   Final Clinical Impressions(s) / ED Diagnoses   Final diagnoses:  SOB (shortness of breath)  Sepsis, due to unspecified organism [facility])    New Prescriptions New Prescriptions   No medications on file     Roxy Horseman, PA-C 05/11/16 0022    Glynn Octave, MD 05/11/16 [phone]

## 2016-05-11 ENCOUNTER — Emergency Department (HOSPITAL_COMMUNITY): Payer: Medicare Other

## 2016-05-11 ENCOUNTER — Encounter (HOSPITAL_COMMUNITY): Payer: Self-pay | Admitting: Radiology

## 2016-05-11 DIAGNOSIS — R509 Fever, unspecified: Secondary | ICD-10-CM | POA: Diagnosis present

## 2016-05-11 DIAGNOSIS — I10 Essential (primary) hypertension: Secondary | ICD-10-CM | POA: Diagnosis not present

## 2016-05-11 DIAGNOSIS — E872 Acidosis: Secondary | ICD-10-CM | POA: Diagnosis present

## 2016-05-11 DIAGNOSIS — E785 Hyperlipidemia, unspecified: Secondary | ICD-10-CM | POA: Diagnosis present

## 2016-05-11 DIAGNOSIS — E876 Hypokalemia: Secondary | ICD-10-CM | POA: Diagnosis present

## 2016-05-11 DIAGNOSIS — Z823 Family history of stroke: Secondary | ICD-10-CM | POA: Diagnosis not present

## 2016-05-11 DIAGNOSIS — R0682 Tachypnea, not elsewhere classified: Secondary | ICD-10-CM | POA: Diagnosis not present

## 2016-05-11 DIAGNOSIS — R5381 Other malaise: Secondary | ICD-10-CM | POA: Diagnosis not present

## 2016-05-11 DIAGNOSIS — M109 Gout, unspecified: Secondary | ICD-10-CM | POA: Diagnosis present

## 2016-05-11 DIAGNOSIS — Z87891 Personal history of nicotine dependence: Secondary | ICD-10-CM | POA: Diagnosis not present

## 2016-05-11 DIAGNOSIS — A419 Sepsis, unspecified organism: Secondary | ICD-10-CM | POA: Diagnosis not present

## 2016-05-11 DIAGNOSIS — Z8249 Family history of ischemic heart disease and other diseases of the circulatory system: Secondary | ICD-10-CM | POA: Diagnosis not present

## 2016-05-11 LAB — COMPREHENSIVE METABOLIC PANEL
ALBUMIN: 4.1 g/dL (ref 3.5–5.0)
ALK PHOS: 56 U/L (ref 38–126)
ALT: 29 U/L (ref 17–63)
ANION GAP: 12 (ref 5–15)
AST: 33 U/L (ref 15–41)
BUN: 7 mg/dL (ref 6–20)
CALCIUM: 9.4 mg/dL (ref 8.9–10.3)
CHLORIDE: 99 mmol/L — AB (ref 101–111)
CO2: 23 mmol/L (ref 22–32)
Creatinine, Ser: 0.89 mg/dL (ref 0.61–1.24)
GFR calc non Af Amer: >60 mL/min (ref >60)
Glucose, Bld: 171 mg/dL — ABNORMAL HIGH (ref 65–99)
POTASSIUM: 3.6 mmol/L (ref 3.5–5.1)
SODIUM: 134 mmol/L — AB (ref 135–145)
Total Bilirubin: 0.5 mg/dL (ref 0.3–1.2)
Total Protein: 7.4 g/dL (ref 6.5–8.1)

## 2016-05-11 LAB — URINALYSIS, ROUTINE W REFLEX MICROSCOPIC
BILIRUBIN URINE: NEGATIVE
Glucose, UA: NEGATIVE mg/dL
HGB URINE DIPSTICK: NEGATIVE
KETONES UR: NEGATIVE mg/dL
Leukocytes, UA: NEGATIVE
NITRITE: NEGATIVE
PH: 6 (ref 5.0–8.0)
Protein, ur: NEGATIVE mg/dL
SPECIFIC GRAVITY, URINE: 1.01 (ref 1.005–1.030)

## 2016-05-11 LAB — BASIC METABOLIC PANEL
ANION GAP: 7 (ref 5–15)
BUN: 7 mg/dL (ref 6–20)
CHLORIDE: 107 mmol/L (ref 101–111)
CO2: 23 mmol/L (ref 22–32)
CREATININE: 0.72 mg/dL (ref 0.61–1.24)
Calcium: 8.3 mg/dL — ABNORMAL LOW (ref 8.9–10.3)
GFR calc non Af Amer: >60 mL/min (ref >60)
GLUCOSE: 139 mg/dL — AB (ref 65–99)
Potassium: 3.5 mmol/L (ref 3.5–5.1)
Sodium: 137 mmol/L (ref 135–145)

## 2016-05-11 LAB — TROPONIN I

## 2016-05-11 LAB — CBC
HCT: 36.7 % — ABNORMAL LOW (ref 39.0–52.0)
HEMOGLOBIN: 12.4 g/dL — AB (ref 13.0–17.0)
MCH: 28.2 pg (ref 26.0–34.0)
MCHC: 33.8 g/dL (ref 30.0–36.0)
MCV: 83.6 fL (ref 78.0–100.0)
Platelets: 176 10*3/uL (ref 150–400)
RBC: 4.39 MIL/uL (ref 4.22–5.81)
RDW: 13.6 % (ref 11.5–15.5)
WBC: 7.5 10*3/uL (ref 4.0–10.5)

## 2016-05-11 LAB — I-STAT CG4 LACTIC ACID, ED: LACTIC ACID, VENOUS: 3.14 mmol/L — AB (ref 0.5–1.9)

## 2016-05-11 LAB — INFLUENZA PANEL BY PCR (TYPE A & B)
H1N1 flu by pcr: NOT DETECTED
INFLAPCR: NEGATIVE
INFLBPCR: NEGATIVE

## 2016-05-11 LAB — LACTIC ACID, PLASMA
Lactic Acid, Venous: 3.1 mmol/L (ref 0.5–1.9)
Lactic Acid, Venous: 2.2 mmol/L (ref 0.5–1.9)

## 2016-05-11 LAB — URIC ACID: Uric Acid, Serum: 5.3 mg/dL (ref 4.4–7.6)

## 2016-05-11 LAB — PROCALCITONIN

## 2016-05-11 LAB — BRAIN NATRIURETIC PEPTIDE: B Natriuretic Peptide: 16 pg/mL (ref 0.0–100.0)

## 2016-05-11 LAB — SEDIMENTATION RATE: Sed Rate: 22 mm/hr — ABNORMAL HIGH (ref 0–16)

## 2016-05-11 MED ORDER — IOPAMIDOL (ISOVUE-370) INJECTION 76%
100.0000 mL | Freq: Once | INTRAVENOUS | Status: AC | PRN
  Administered 2016-05-11: 100 mL via INTRAVENOUS

## 2016-05-11 MED ORDER — ACETAMINOPHEN 650 MG RE SUPP: 650.0000 mg | Freq: Four times a day (QID) | RECTAL | Status: DC | PRN

## 2016-05-11 MED ORDER — SODIUM CHLORIDE 0.9% FLUSH
3.0000 mL | Freq: Two times a day (BID) | INTRAVENOUS | Status: DC
  Administered 2016-05-11 – 2016-05-12 (×2): 3 mL via INTRAVENOUS
  Administered 2016-05-12: 09:00:00 via INTRAVENOUS
  Administered 2016-05-14 – 2016-05-15 (×4): 3 mL via INTRAVENOUS

## 2016-05-11 MED ORDER — PIPERACILLIN-TAZOBACTAM 3.375 G IVPB
3.3750 g | Freq: Three times a day (TID) | INTRAVENOUS | Status: DC
  Administered 2016-05-11 – 2016-05-14 (×11): 3.375 g via INTRAVENOUS
  Filled 2016-05-11 (×11): qty 50

## 2016-05-11 MED ORDER — ALLOPURINOL 100 MG PO TABS
100.0000 mg | ORAL_TABLET | Freq: Every day | ORAL | Status: DC
  Administered 2016-05-11 – 2016-05-15 (×5): 100 mg via ORAL
  Filled 2016-05-11 (×5): qty 1

## 2016-05-11 MED ORDER — SODIUM CHLORIDE 0.9 % IV SOLN
INTRAVENOUS | Status: AC
  Administered 2016-05-11 (×2): via INTRAVENOUS

## 2016-05-11 MED ORDER — ONDANSETRON HCL 4 MG/2ML IJ SOLN: 4.0000 mg | Freq: Four times a day (QID) | INTRAMUSCULAR | Status: DC | PRN

## 2016-05-11 MED ORDER — HYDROCODONE-ACETAMINOPHEN 10-325 MG PO TABS
1.0000 | ORAL_TABLET | Freq: Four times a day (QID) | ORAL | Status: DC | PRN
  Administered 2016-05-11 – 2016-05-15 (×11): 1 via ORAL
  Filled 2016-05-11 (×11): qty 1

## 2016-05-11 MED ORDER — VANCOMYCIN HCL IN DEXTROSE 1-5 GM/200ML-% IV SOLN
1000.0000 mg | Freq: Two times a day (BID) | INTRAVENOUS | Status: DC
  Administered 2016-05-11 – 2016-05-14 (×7): 1000 mg via INTRAVENOUS
  Filled 2016-05-11 (×7): qty 200

## 2016-05-11 MED ORDER — ONDANSETRON HCL 4 MG PO TABS: 4.0000 mg | ORAL_TABLET | Freq: Four times a day (QID) | ORAL | Status: DC | PRN

## 2016-05-11 MED ORDER — AMLODIPINE BESYLATE 5 MG PO TABS
10.0000 mg | ORAL_TABLET | Freq: Every day | ORAL | Status: DC
  Administered 2016-05-11 – 2016-05-15 (×5): 10 mg via ORAL
  Filled 2016-05-11 (×5): qty 2

## 2016-05-11 MED ORDER — ACETAMINOPHEN 325 MG PO TABS
650.0000 mg | ORAL_TABLET | Freq: Four times a day (QID) | ORAL | Status: DC | PRN
  Administered 2016-05-11: 650 mg via ORAL
  Filled 2016-05-11: qty 2

## 2016-05-11 NOTE — Progress Notes (Signed)
Pharmacy Antibiotic Note  Ian Roberson is a 65 y.o. male admitted on 05/10/2016 with sepsis.  Pharmacy has been consulted for vancomycin and zosyn dosing.  Plan: Vancomycin 2000mg  loading dose given in ED, then 1000mg  IV every 12 hours.  Goal trough 15-20 mcg/mL. Zosyn 3.375g IV q8h (4 hour infusion).  F/U cultures and clinical course Monitor V/S and labs Deescalate as indicated  Height: 6\' 2"  (188 cm) Weight: 249 lb 9.6 oz (113.2 kg) IBW/kg (Calculated) : 82.2  Temp (24hrs), Avg:99.5 F (37.5 C), Min:98.5 F (36.9 C), Max:101.2 F (38.4 C)   Recent Labs Lab 05/10/16 2215 05/10/16 2231 05/11/16 0105 05/11/16 0524  WBC 9.5  --   --  7.5  CREATININE 0.89  --   --  0.72  LATICACIDVEN  --  3.14* 3.1*  3.14* 2.2*    Estimated Creatinine Clearance: 126.5 mL/min (by C-G formula based on SCr of 0.8 mg/dL).    Allergies  Allergen Reactions  . Penicillins Other (See Comments)    Convulsions Has patient had a PCN reaction causing immediate rash, facial/tongue/throat swelling, SOB or lightheadedness with hypotension: No Has patient had a PCN reaction causing severe rash involving mucus membranes or skin necrosis: No Has patient had a PCN reaction that required hospitalization No Has patient had a PCN reaction occurring within the last 10 years: No If all of the above answers are "NO", then may proceed with Cephalosporin use.     Antimicrobials this admission: Vancomycin 9/4 >>  Zosyn 9/4 >>   Dose adjustments this admission: n/a  Microbiology results: 9/4 BCx: pending 9/4 UCx: pending  Thank you for allowing pharmacy to be a part of this patient's care.  Elder CyphersLorie Mariadelcarmen Corella, BS Pharm D, [place] Clinical Pharmacist Pager [phone] 05/11/2016 8:27 AM

## 2016-05-11 NOTE — Care Management Note (Signed)
Case Management Note  Patient Details  Name: Ian Roberson MRN: [id number] Date of Birth: February 08, 1951  Subjective/Objective:                  Pt admitted with sepsis. He is under observation at this time. He is from home, lives with his wife and is ind with ADL's. [place] Medicare and [facility] [place] and . Attempts made to contact [facility] to make them aware of admission. [facility] (not even to leave a message, may be because it is a holiday). Pt info faxed and further attempts at f/u will be made tomorrow. Pt has PCP, transportation and no difficulty affording his medications. Pt plans to return home with self care at DC.   Action/Plan: Will cont to follow.   Expected Discharge Date:    05/12/2016              Expected Discharge Plan:  Home/Self Care  In-House Referral:  NA  Discharge planning Services  CM Consult  Post Acute Care Choice:  NA Choice offered to:  NA  DME Arranged:    DME Agency:     HH Arranged:    HH Agency:     Status of Service:  In process, will continue to follow  If discussed at Long Length of Stay Meetings, dates discussed:    Additional Comments:  Malcolm MetroChild[place], Katherina Wimer Demske, RN 05/11/2016, 9:57 AM

## 2016-05-11 NOTE — Progress Notes (Signed)
PROGRESS NOTE    Ian Roberson  JQZ:[id number] DOB: 17-Aug-1951 DOA: 05/10/2016 PCP: No PCP Per Patient    Brief Narrative:  65 y.o. male with medical history significant of HTN, gout comes in with one day of not feeling well.  Pt reports he woke up this am feeling "achy" and tired.  He was normal when he went to bed.  He did not eat breakfast as he was going to a big bbq later in the afternoon.  He went to the BBQ at while there he started feeling worse like he was running a fever and having a lot of body aches.  He denies any sick contacts.  No n/v/d.  No chest pain, no abdominal pain.  No dysuria, change in urinary symptoms or hematuria.  No rashes on his body.  No pain on his skin anywhere.  On arrival he was febrile over 102 and tachycardic.  No cough.  Denies sob (althought this is mentioned in the ED).  He was covered with broad spectrum abx for possible sepsis.  He feels back to normal now, after getting 3 liters of ivf, and tylenol, abx in the ED.  Pt referred for admission for fever with lactic acid level of 3.  Assessment & Plan:   Principal Problem:   Fever Active Problems:   Essential hypertension   Hyperlipidemia   Malaise   Tachypnea   1. SIRS of unclear etiology 1. Patient febrile with elevated lactate 2. CXR unremarkable. UA reviewed and was unremarkable 3. Blood cultures obtained, pending 4. Patient has been continued on empiric broad spectrum IV abx 5. Will check ESR and uric acid level given hx of gout 2. HTN 1. BP stable 2. Continue home regimen with exception of HCTZ given hx of gout 3. HLD 1. Seems stable 4. Weakness 1. Stable at present 2. Monitor for now 5. Hx gout 1. Patient reports prior gout in large R toe several years ago 2. Currently pt reports R knee pain and B shoulder pains 3. Will check uric acid and ESR per above 4. Continue allopurinol  DVT prophylaxis: SCD's Code Status: Full Family Communication: Pt in room, family at  bedside Disposition Plan: Uncertain at this time  Consultants:     Procedures:     Antimicrobials: Anti-infectives    Start     Dose/Rate Route Frequency Ordered Stop   05/11/16 1300  vancomycin (VANCOCIN) IVPB 1000 mg/200 mL premix     1,000 mg 200 mL/hr over 60 Minutes Intravenous Every 12 hours 05/11/16 0826     05/11/16 0700  piperacillin-tazobactam (ZOSYN) IVPB 3.375 g     3.375 g 12.5 mL/hr over 240 Minutes Intravenous Every 8 hours 05/11/16 0233     05/10/16 2330  vancomycin (VANCOCIN) IVPB 1000 mg/200 mL premix     1,000 mg 200 mL/hr over 60 Minutes Intravenous Every 1 hr x 2 05/10/16 2231 05/11/16 0210   05/10/16 2230  piperacillin-tazobactam (ZOSYN) IVPB 3.375 g     3.375 g 100 mL/hr over 30 Minutes Intravenous  Once 05/10/16 2220 05/10/16 2322   05/10/16 2230  vancomycin (VANCOCIN) IVPB 1000 mg/200 mL premix  Status:  Discontinued     1,000 mg 200 mL/hr over 60 Minutes Intravenous  Once 05/10/16 2220 05/10/16 2231       Subjective: Feels weak this AM  Objective: Vitals:   05/11/16 0130 05/11/16 0234 05/11/16 0537 05/11/16 1015  BP: 159/91  138/84 (!) 156/82  Pulse: 109  96 (!) 101  Resp:  '25 20 20 20  ' Temp:  98.5 F (36.9 C) 98.9 F (37.2 C) 99.4 F (37.4 C)  TempSrc:  Oral Oral Oral  SpO2: 98% 100% 100%   Weight:  113.2 kg (249 lb 9.6 oz)    Height:  '6\' 2"'  (1.88 m)      Intake/Output Summary (Last 24 hours) at 05/11/16 1413 Last data filed at 05/11/16 0049  Gross per 24 hour  Intake             2250 ml  Output              100 ml  Net             2150 ml   Filed Weights   05/10/16 2120 05/11/16 0234  Weight: 108 kg (238 lb) 113.2 kg (249 lb 9.6 oz)    Examination:  General exam: Appears calm and comfortable, laying in bed Respiratory system: Clear to auscultation. Respiratory effort normal. Cardiovascular system: S1 & S2 heard, RRR.  Gastrointestinal system: Abdomen is nondistended, soft and nontender. No organomegaly or masses felt.  Normal bowel sounds heard. Central nervous system: Alert and oriented. No focal neurological deficits. Extremities: Symmetric 5 x 5 power. Skin: No rashes, lesions or ulcers Psychiatry: Judgement and insight appear normal. Mood & affect appropriate.   Data Reviewed: I have personally reviewed following labs and imaging studies  CBC:  Recent Labs Lab 05/10/16 2215 05/11/16 0524  WBC 9.5 7.5  NEUTROABS 7.7  --   HGB 13.6 12.4*  HCT 39.4 36.7*  MCV 82.9 83.6  PLT 193 683   Basic Metabolic Panel:  Recent Labs Lab 05/10/16 2215 05/11/16 0524  NA 134* 137  K 3.6 3.5  CL 99* 107  CO2 23 23  GLUCOSE 171* 139*  BUN 7 7  CREATININE 0.89 0.72  CALCIUM 9.4 8.3*   GFR: Estimated Creatinine Clearance: 126.5 mL/min (by C-G formula based on SCr of 0.8 mg/dL). Liver Function Tests:  Recent Labs Lab 05/10/16 2215  AST 33  ALT 29  ALKPHOS 56  BILITOT 0.5  PROT 7.4  ALBUMIN 4.1   No results for input(s): LIPASE, AMYLASE in the last 168 hours. No results for input(s): AMMONIA in the last 168 hours. Coagulation Profile: No results for input(s): INR, PROTIME in the last 168 hours. Cardiac Enzymes:  Recent Labs Lab 05/10/16 2215  TROPONINI <0.03   BNP (last 3 results) No results for input(s): PROBNP in the last 8760 hours. HbA1C: No results for input(s): HGBA1C in the last 72 hours. CBG: No results for input(s): GLUCAP in the last 168 hours. Lipid Profile: No results for input(s): CHOL, HDL, LDLCALC, TRIG, CHOLHDL, LDLDIRECT in the last 72 hours. Thyroid Function Tests: No results for input(s): TSH, T4TOTAL, FREET4, T3FREE, THYROIDAB in the last 72 hours. Anemia Panel: No results for input(s): VITAMINB12, FOLATE, FERRITIN, TIBC, IRON, RETICCTPCT in the last 72 hours. Sepsis Labs:  Recent Labs Lab 05/10/16 2231 05/11/16 0059 05/11/16 0105 05/11/16 0524  PROCALCITON  --  <0.10  --   --   LATICACIDVEN 3.14*  --  3.1*  3.14* 2.2*    Recent Results (from the  past 240 hour(s))  Culture, blood (Routine x 2)     Status: None (Preliminary result)   Collection Time: 05/10/16 10:15 PM  Result Value Ref Range Status   Specimen Description RIGHT ANTECUBITAL  Final   Special Requests BOTTLES DRAWN AEROBIC AND ANAEROBIC [place]  Final   Culture PENDING  Incomplete  Report Status PENDING  Incomplete  Culture, blood (Routine x 2)     Status: None (Preliminary result)   Collection Time: 05/10/16 10:26 PM  Result Value Ref Range Status   Specimen Description LEFT ANTECUBITAL  Final   Special Requests BOTTLES DRAWN AEROBIC AND ANAEROBIC [facility] EACH  Final   Culture PENDING  Incomplete   Report Status PENDING  Incomplete     Radiology Studies: Dg Chest 2 View  Result Date: 05/10/2016 CLINICAL DATA:  Initial evaluation for acute shortness of breath. EXAM: CHEST  2 VIEW COMPARISON:  Prior CT and radiograph from 04/20/2016. FINDINGS: The cardiac and mediastinal silhouettes are stable in size and contour, and remain within normal limits. The lungs are normally inflated. No airspace consolidation, pleural effusion, or pulmonary edema is identified. There is no pneumothorax. No acute osseous abnormality identified. IMPRESSION: No active cardiopulmonary disease. Electronically Signed   By: Jeannine Boga M.D.   On: 05/10/2016 21:58   Ct Angio Chest Pe W Or Wo Contrast  Result Date: 05/11/2016 CLINICAL DATA:  Acute onset of shortness of breath, worsened with exertion. Fever. Initial encounter. EXAM: CT ANGIOGRAPHY CHEST WITH CONTRAST TECHNIQUE: Multidetector CT imaging of the chest was performed using the standard protocol during bolus administration of intravenous contrast. Multiplanar CT image reconstructions and MIPs were obtained to evaluate the vascular anatomy. CONTRAST:  100 mL of Isovue 370 IV contrast COMPARISON:  Chest radiograph performed 05/10/2016, and CTA of the chest performed 04/20/2016 FINDINGS: There is no evidence of pulmonary embolus. The lungs  are clear bilaterally. There is no evidence of significant focal consolidation, pleural effusion or pneumothorax. No masses are identified; no abnormal focal contrast enhancement is seen. The mediastinum is unremarkable in appearance. No mediastinal lymphadenopathy seen. No pericardial effusion is identified. The great vessels are grossly unremarkable in appearance. No axillary lymphadenopathy is seen. The visualized portions of the thyroid gland are unremarkable in appearance. The visualized portions of the liver and spleen are unremarkable. The visualized portions of the pancreas, gallbladder, stomach, adrenal glands and kidneys are within normal limits. No acute osseous abnormalities are seen. Mild degenerative change is noted at the lower cervical spine. Review of the MIP images confirms the above findings. IMPRESSION: No evidence of pulmonary embolus.  Lungs clear bilaterally. Electronically Signed   By: Garald Balding M.D.   On: 05/11/2016 00:53    Scheduled Meds: . allopurinol  100 mg Oral Daily  . amLODipine  10 mg Oral Daily  . piperacillin-tazobactam (ZOSYN)  IV  3.375 g Intravenous Q8H  . sodium chloride flush  3 mL Intravenous Q12H  . vancomycin  1,000 mg Intravenous Q12H   Continuous Infusions: . sodium chloride 100 mL/hr at 05/11/16 1148     LOS: 0 days   Donoven Pett, Orpah Melter, MD [facility] Pager [phone]  If 7PM-7AM, please contact night-coverage [email or web address] Password [email or web address] 05/11/2016, 2:13 PM

## 2016-05-11 NOTE — H&P (Signed)
History and Physical    Ian MemoJasper Kloc ZOX:[id number] DOB: 04-08-1951 DOA: 05/10/2016  PCP: No PCP Per Patient  Patient coming from:  home  Chief Complaint:   fever  HPI: Ian MemoJasper Roberson is a 65 y.o. male with medical history significant of HTN, gout comes in with one day of not feeling well.  Pt reports he woke up this am feeling "achy" and tired.  He was normal when he went to bed.  He did not eat breakfast as he was going to a big bbq later in the afternoon.  He went to the BBQ at while there he started feeling worse like he was running a fever and having a lot of body aches.  He denies any sick contacts.  No n/v/d.  No chest pain, no abdominal pain.  No dysuria, change in urinary symptoms or hematuria.  No rashes on his body.  No pain on his skin anywhere.  On arrival he was febrile over 102 and tachycardic.  No cough.  Denies sob (althought this is mentioned in the ED).  He was covered with broad spectrum abx for possible sepsis.  He feels back to normal now, after getting 3 liters of ivf, and tylenol, abx in the ED.  Pt referred for admission for fever with lactic acid level of 3.  Review of Systems: As per HPI otherwise 10 point review of systems negative.   Past Medical History:  Diagnosis Date  . Gout   . Hyperlipidemia   . Hypertension     Past Surgical History:  Procedure Laterality Date  . FOOT SURGERY    . KNEE SURGERY    . NECK SURGERY       reports that he has quit smoking. His smoking use included Cigarettes. He has never used smokeless tobacco. He reports that he drinks alcohol. He reports that he does not use drugs.  Allergies  Allergen Reactions  . Penicillins Other (See Comments)    Convulsions Has patient had a PCN reaction causing immediate rash, facial/tongue/throat swelling, SOB or lightheadedness with hypotension: No Has patient had a PCN reaction causing severe rash involving mucus membranes or skin necrosis: No Has patient had a PCN reaction that  required hospitalization No Has patient had a PCN reaction occurring within the last 10 years: No If all of the above answers are "NO", then may proceed with Cephalosporin use.     Family History  Problem Relation Age of Onset  . Heart attack Father   . Stroke Mother     Prior to Admission medications   Medication Sig Start Date End Date Taking? Authorizing Provider  allopurinol (ZYLOPRIM) 100 MG tablet Take 100 mg by mouth daily.    Historical Provider, MD  amLODipine (NORVASC) 10 MG tablet Take 10 mg by mouth daily.    Historical Provider, MD  Cholecalciferol (VITAMIN D) 2000 units CAPS Take 1 capsule by mouth daily.    Historical Provider, MD  hydrochlorothiazide (HYDRODIURIL) 25 MG tablet Take 25 mg by mouth daily.    Historical Provider, MD  HYDROcodone-acetaminophen (NORCO) 10-325 MG tablet Take 1 tablet by mouth every 6 (six) hours as needed for moderate pain.    Historical Provider, MD    Physical Exam: Vitals:   05/11/16 0015 05/11/16 0021 05/11/16 0100 05/11/16 0130  BP:   151/90 159/91  Pulse: 115  107 109  Resp: 20  (!) 31 25  Temp:  99.4 F (37.4 C)    TempSrc:  Oral  SpO2: 100%  98% 98%  Weight:      Height:        Constitutional: NAD, calm, comfortable Vitals:   05/11/16 0015 05/11/16 0021 05/11/16 0100 05/11/16 0130  BP:   151/90 159/91  Pulse: 115  107 109  Resp: 20  (!) 31 25  Temp:  99.4 F (37.4 C)    TempSrc:  Oral    SpO2: 100%  98% 98%  Weight:      Height:       Eyes: PERRL, lids and conjunctivae normal ENMT: Mucous membranes are moist. Posterior pharynx clear of any exudate or lesions.Normal dentition.  Neck: normal, supple, no masses, no thyromegaly Respiratory: clear to auscultation bilaterally, no wheezing, no crackles. Normal respiratory effort. No accessory muscle use.  Cardiovascular: Regular rate and rhythm, no murmurs / rubs / gallops. No extremity edema. 2+ pedal pulses. No carotid bruits.  Abdomen: no tenderness, no masses  palpated. No hepatosplenomegaly. Bowel sounds positive.  Musculoskeletal: no clubbing / cyanosis. No joint deformity upper and lower extremities. Good ROM, no contractures. Normal muscle tone.  Skin: no rashes, lesions, ulcers. No induration Neurologic: CN 2-12 grossly intact. Sensation intact, DTR normal. Strength 5/5 in all 4.  Psychiatric: Normal judgment and insight. Alert and oriented x 3. Normal mood.    Labs on Admission: I have personally reviewed following labs and imaging studies  CBC:  Recent Labs Lab 05/10/16 2215  WBC 9.5  NEUTROABS 7.7  HGB 13.6  HCT 39.4  MCV 82.9  PLT 193   Basic Metabolic Panel:  Recent Labs Lab 05/10/16 2215  NA 134*  K 3.6  CL 99*  CO2 23  GLUCOSE 171*  BUN 7  CREATININE 0.89  CALCIUM 9.4   GFR: Estimated Creatinine Clearance: 111.2 mL/min (by C-G formula based on SCr of 0.89 mg/dL). Liver Function Tests:  Recent Labs Lab 05/10/16 2215  AST 33  ALT 29  ALKPHOS 56  BILITOT 0.5  PROT 7.4  ALBUMIN 4.1   Cardiac Enzymes:  Recent Labs Lab 05/10/16 2215  TROPONINI <0.03   Urine analysis:    Component Value Date/Time   COLORURINE YELLOW 05/10/2016 2310   APPEARANCEUR CLEAR 05/10/2016 2310   LABSPEC 1.010 05/10/2016 2310   PHURINE 6.0 05/10/2016 2310   GLUCOSEU NEGATIVE 05/10/2016 2310   HGBUR NEGATIVE 05/10/2016 2310   BILIRUBINUR NEGATIVE 05/10/2016 2310   KETONESUR NEGATIVE 05/10/2016 2310   PROTEINUR NEGATIVE 05/10/2016 2310   NITRITE NEGATIVE 05/10/2016 2310   LEUKOCYTESUR NEGATIVE 05/10/2016 2310    Recent Results (from the past 240 hour(s))  Culture, blood (Routine x 2)     Status: None (Preliminary result)   Collection Time: 05/10/16 10:15 PM  Result Value Ref Range Status   Specimen Description RIGHT ANTECUBITAL  Final   Special Requests BOTTLES DRAWN AEROBIC AND ANAEROBIC [facility] EACH  Final   Culture PENDING  Incomplete   Report Status PENDING  Incomplete  Culture, blood (Routine x 2)     Status: None  (Preliminary result)   Collection Time: 05/10/16 10:26 PM  Result Value Ref Range Status   Specimen Description LEFT ANTECUBITAL  Final   Special Requests BOTTLES DRAWN AEROBIC AND ANAEROBIC [facility] EACH  Final   Culture PENDING  Incomplete   Report Status PENDING  Incomplete     Radiological Exams on Admission: Dg Chest 2 View  Result Date: 05/10/2016 CLINICAL DATA:  Initial evaluation for acute shortness of breath. EXAM: CHEST  2 VIEW COMPARISON:  Prior CT and  radiograph from 04/20/2016. FINDINGS: The cardiac and mediastinal silhouettes are stable in size and contour, and remain within normal limits. The lungs are normally inflated. No airspace consolidation, pleural effusion, or pulmonary edema is identified. There is no pneumothorax. No acute osseous abnormality identified. IMPRESSION: No active cardiopulmonary disease. Electronically Signed   By: Rise Mu M.D.   On: 05/10/2016 21:58   Ct Angio Chest Pe W Or Wo Contrast  Result Date: 05/11/2016 CLINICAL DATA:  Acute onset of shortness of breath, worsened with exertion. Fever. Initial encounter. EXAM: CT ANGIOGRAPHY CHEST WITH CONTRAST TECHNIQUE: Multidetector CT imaging of the chest was performed using the standard protocol during bolus administration of intravenous contrast. Multiplanar CT image reconstructions and MIPs were obtained to evaluate the vascular anatomy. CONTRAST:  100 mL of Isovue 370 IV contrast COMPARISON:  Chest radiograph performed 05/10/2016, and CTA of the chest performed 04/20/2016 FINDINGS: There is no evidence of pulmonary embolus. The lungs are clear bilaterally. There is no evidence of significant focal consolidation, pleural effusion or pneumothorax. No masses are identified; no abnormal focal contrast enhancement is seen. The mediastinum is unremarkable in appearance. No mediastinal lymphadenopathy seen. No pericardial effusion is identified. The great vessels are grossly unremarkable in appearance. No axillary  lymphadenopathy is seen. The visualized portions of the thyroid gland are unremarkable in appearance. The visualized portions of the liver and spleen are unremarkable. The visualized portions of the pancreas, gallbladder, stomach, adrenal glands and kidneys are within normal limits. No acute osseous abnormalities are seen. Mild degenerative change is noted at the lower cervical spine. Review of the MIP images confirms the above findings. IMPRESSION: No evidence of pulmonary embolus.  Lungs clear bilaterally. Electronically Signed   By: Roanna Raider M.D.   On: 05/11/2016 00:53    EKG: Independently reviewed. Sinus tachycardia no acute issues  Assessment/Plan 65 yo male with fever, body aches, lactic acidosis of unclear etiology  Principal Problem:   Fever- ua is normal.  As is cxr.  Blood cx obtained.  Initial lactic acid level was 3.4, pt given 3 liters of ivf and repeat lactic acid level 3 hours later was 3.4 (suspicious, clarifying with lab if this was done on the same original sample).  procalcitonin level in neg.  Probably viral syndrome.  Covering with vanc/zosyn pending culture data.  Active Problems:   Essential hypertension   Hyperlipidemia   Malaise   Tachypnea   obs on tele.    DVT prophylaxis:  scds Code Status:  full Family Communication:  none Disposition Plan:   Per day team Consults called:  none Admission status:  obs   DAVID,RACHAL A MD [facility]  If 7PM-7AM, please contact night-coverage [email or web address] Password [email or web address]  05/11/2016, 1:46 AM

## 2016-05-11 NOTE — Care Management Obs Status (Signed)
MEDICARE OBSERVATION STATUS NOTIFICATION   Patient Details  Name: Ian Roberson MRN: [id number] Date of Birth: 07-Jun-1951   Medicare Observation Status Notification Given:  Yes    Malcolm MetroChildress, Samaad Hashem Demske, RN 05/11/2016, 9:50 AM

## 2016-05-11 NOTE — Progress Notes (Signed)
ANTIBIOTIC CONSULT NOTE-Preliminary  Pharmacy Consult for vancomycin and zosyn Indication: sepsis  Allergies  Allergen Reactions  . Penicillins Other (See Comments)    Convulsions Has patient had a PCN reaction causing immediate rash, facial/tongue/throat swelling, SOB or lightheadedness with hypotension: No Has patient had a PCN reaction causing severe rash involving mucus membranes or skin necrosis: No Has patient had a PCN reaction that required hospitalization No Has patient had a PCN reaction occurring within the last 10 years: No If all of the above answers are "NO", then may proceed with Cephalosporin use.     Patient Measurements: Height: 6\' 2"  (188 cm) Weight: 238 lb (108 kg) IBW/kg (Calculated) : 82.2 Adjusted Body Weight:   Vital Signs: Temp: 99.4 F (37.4 C) (09/04 0021) Temp Source: Oral (09/04 0021) BP: 159/91 (09/04 0130) Pulse Rate: 109 (09/04 0130)  Labs:  Recent Labs  05/10/16 2215  WBC 9.5  HGB 13.6  PLT 193  CREATININE 0.89    Estimated Creatinine Clearance: 111.2 mL/min (by C-G formula based on SCr of 0.89 mg/dL).  No results for input(s): VANCOTROUGH, VANCOPEAK, VANCORANDOM, GENTTROUGH, GENTPEAK, GENTRANDOM, TOBRATROUGH, TOBRAPEAK, TOBRARND, AMIKACINPEAK, AMIKACINTROU, AMIKACIN in the last 72 hours.   Microbiology: Recent Results (from the past 720 hour(s))  Culture, blood (Routine x 2)     Status: None (Preliminary result)   Collection Time: 05/10/16 10:15 PM  Result Value Ref Range Status   Specimen Description RIGHT ANTECUBITAL  Final   Special Requests BOTTLES DRAWN AEROBIC AND ANAEROBIC [facility] EACH  Final   Culture PENDING  Incomplete   Report Status PENDING  Incomplete  Culture, blood (Routine x 2)     Status: None (Preliminary result)   Collection Time: 05/10/16 10:26 PM  Result Value Ref Range Status   Specimen Description LEFT ANTECUBITAL  Final   Special Requests BOTTLES DRAWN AEROBIC AND ANAEROBIC [facility] EACH  Final   Culture  PENDING  Incomplete   Report Status PENDING  Incomplete    Medical History: Past Medical History:  Diagnosis Date  . Gout   . Hyperlipidemia   . Hypertension     Medications:  Prescriptions Prior to Admission  Medication Sig Dispense Refill Last Dose  . allopurinol (ZYLOPRIM) 100 MG tablet Take 100 mg by mouth daily.   04/20/2016 at Unknown time  . amLODipine (NORVASC) 10 MG tablet Take 10 mg by mouth daily.   04/20/2016 at Unknown time  . Cholecalciferol (VITAMIN D) 2000 units CAPS Take 1 capsule by mouth daily.   04/20/2016 at Unknown time  . hydrochlorothiazide (HYDRODIURIL) 25 MG tablet Take 25 mg by mouth daily.   04/20/2016 at Unknown time  . HYDROcodone-acetaminophen (NORCO) 10-325 MG tablet Take 1 tablet by mouth every 6 (six) hours as needed for moderate pain.   04/20/2016 at Unknown time   Scheduled:  . piperacillin-tazobactam (ZOSYN)  IV  3.375 g Intravenous Q8H  . sodium chloride flush  3 mL Intravenous Q12H   Infusions:  . sodium chloride     PRN: acetaminophen **OR** acetaminophen, ondansetron **OR** ondansetron (ZOFRAN) IV Anti-infectives    Start     Dose/Rate Route Frequency Ordered Stop   05/11/16 0700  piperacillin-tazobactam (ZOSYN) IVPB 3.375 g     3.375 g 12.5 mL/hr over 240 Minutes Intravenous Every 8 hours 05/11/16 0233     05/10/16 2330  vancomycin (VANCOCIN) IVPB 1000 mg/200 mL premix     1,000 mg 200 mL/hr over 60 Minutes Intravenous Every 1 hr x 2 05/10/16 2231 05/11/16 0210  05/10/16 2230  piperacillin-tazobactam (ZOSYN) IVPB 3.375 g     3.375 g 100 mL/hr over 30 Minutes Intravenous  Once 05/10/16 2220 05/10/16 2322   05/10/16 2230  vancomycin (VANCOCIN) IVPB 1000 mg/200 mL premix  Status:  Discontinued     1,000 mg 200 mL/hr over 60 Minutes Intravenous  Once 05/10/16 2220 05/10/16 2231      Assessment: 65 yo male with SOB and body aches. Empiric abx started for SIRS.   Goal of Therapy:  Vancomycin trough level 15-20 mcg/ml  Plan:   Preliminary review of pertinent patient information completed.  Protocol will be initiated with a one-time dose(s) of vancomycin 1 gram Q1 hr x 2 and Zosyn 3.375 grams Q 8 hours to start 8 hours after ED dose. Pattricia Boss [facility] clinical pharmacist will complete review during morning rounds to assess patient and finalize treatment regimen.  Katessa Attridge Scarlett, RPH 05/11/2016,2:35 AM

## 2016-05-12 LAB — CBC
HCT: 37.6 % — ABNORMAL LOW (ref 39.0–52.0)
Hemoglobin: 13 g/dL (ref 13.0–17.0)
MCH: 28.4 pg (ref 26.0–34.0)
MCHC: 34.6 g/dL (ref 30.0–36.0)
MCV: 82.3 fL (ref 78.0–100.0)
Platelets: 168 10*3/uL (ref 150–400)
RBC: 4.57 MIL/uL (ref 4.22–5.81)
RDW: 12.9 % (ref 11.5–15.5)
WBC: 7.8 10*3/uL (ref 4.0–10.5)

## 2016-05-12 LAB — BASIC METABOLIC PANEL
Anion gap: 7 (ref 5–15)
BUN: 6 mg/dL (ref 6–20)
CALCIUM: 8.7 mg/dL — AB (ref 8.9–10.3)
CO2: 23 mmol/L (ref 22–32)
CREATININE: 0.81 mg/dL (ref 0.61–1.24)
Chloride: 105 mmol/L (ref 101–111)
GFR calc Af Amer: >60 mL/min (ref >60)
GFR calc non Af Amer: >60 mL/min (ref >60)
GLUCOSE: 143 mg/dL — AB (ref 65–99)
Potassium: 3.1 mmol/L — ABNORMAL LOW (ref 3.5–5.1)
Sodium: 135 mmol/L (ref 135–145)

## 2016-05-12 MED ORDER — POTASSIUM CHLORIDE CRYS ER 20 MEQ PO TBCR
40.0000 meq | EXTENDED_RELEASE_TABLET | Freq: Two times a day (BID) | ORAL | Status: AC
  Administered 2016-05-12 (×2): 40 meq via ORAL
  Filled 2016-05-12 (×2): qty 2

## 2016-05-12 NOTE — Progress Notes (Signed)
PROGRESS NOTE    Ian Roberson  EFE:[id number] DOB: Aug 01, 1951 DOA: 05/10/2016 PCP: No PCP Per Patient    Brief Narrative:  65 y.o. male with medical history significant of HTN, gout comes in with one day of not feeling well.  Pt reports he woke up this am feeling "achy" and tired.  He was normal when he went to bed.  He did not eat breakfast as he was going to a big bbq later in the afternoon.  He went to the BBQ at while there he started feeling worse like he was running a fever and having a lot of body aches.  He denies any sick contacts.  No n/v/d.  No chest pain, no abdominal pain.  No dysuria, change in urinary symptoms or hematuria.  No rashes on his body.  No pain on his skin anywhere.  On arrival he was febrile over 102 and tachycardic.  No cough.  Denies sob (althought this is mentioned in the ED).  He was covered with broad spectrum abx for possible sepsis.  He feels back to normal now, after getting 3 liters of ivf, and tylenol, abx in the ED.  Pt referred for admission for fever with lactic acid level of 3.  Assessment & Plan:   Principal Problem:   Fever Active Problems:   Essential hypertension   Hyperlipidemia   Malaise   Tachypnea   Sepsis ([place])   1. SIRS of unclear etiology 1. Patient presented febrile with elevated lactate 2. CXR was unremarkable. UA reviewed and was unremarkable 3. No rashes, open skin lesions, acute joint pains 4. Blood cultures obtained, pending final results, however neg thus far 5. Patient has been continued on empiric broad spectrum IV abx 6. Still febrile, over clinically improving. 2. HTN 1. BP remains stable 2. Continue home regimen with exception of HCTZ given hx of gout 3. HLD 1. Seems stable 4. Weakness 1. Stable at present 2. Monitor for now 5. Hx gout 1. Patient reports prior gout in large R toe several years ago 2. Uric acid and ESR unremarkable 3. Continue allopurinol for now  DVT prophylaxis: SCD's Code Status:  Full Family Communication: Pt in room, family at bedside Disposition Plan: Uncertain at this time  Consultants:     Procedures:     Antimicrobials: Anti-infectives    Start     Dose/Rate Route Frequency Ordered Stop   05/11/16 1300  vancomycin (VANCOCIN) IVPB 1000 mg/200 mL premix     1,000 mg 200 mL/hr over 60 Minutes Intravenous Every 12 hours 05/11/16 0826     05/11/16 0700  piperacillin-tazobactam (ZOSYN) IVPB 3.375 g     3.375 g 12.5 mL/hr over 240 Minutes Intravenous Every 8 hours 05/11/16 0233     05/10/16 2330  vancomycin (VANCOCIN) IVPB 1000 mg/200 mL premix     1,000 mg 200 mL/hr over 60 Minutes Intravenous Every 1 hr x 2 05/10/16 2231 05/11/16 0210   05/10/16 2230  piperacillin-tazobactam (ZOSYN) IVPB 3.375 g     3.375 g 100 mL/hr over 30 Minutes Intravenous  Once 05/10/16 2220 05/10/16 2322   05/10/16 2230  vancomycin (VANCOCIN) IVPB 1000 mg/200 mL premix  Status:  Discontinued     1,000 mg 200 mL/hr over 60 Minutes Intravenous  Once 05/10/16 2220 05/10/16 2231      Subjective: Reports feeling better today  Objective: Vitals:   05/11/16 1952 05/11/16 2005 05/12/16 0011 05/12/16 0425  BP: (!) 148/84  137/67 (!) 152/83  Pulse: 96  90  88  Resp: '20  18 18  ' Temp: 100 F (37.8 C)  100.2 F (37.9 C) 99.8 F (37.7 C)  TempSrc: Oral  Oral Oral  SpO2: 100% 100% 98% 99%  Weight:      Height:        Intake/Output Summary (Last 24 hours) at 05/12/16 1312 Last data filed at 05/12/16 0800  Gross per 24 hour  Intake             2065 ml  Output              250 ml  Net             1815 ml   Filed Weights   05/10/16 2120 05/11/16 0234  Weight: 108 kg (238 lb) 113.2 kg (249 lb 9.6 oz)    Examination:  General exam: Appears calm and comfortable, laying in bed, conversant Respiratory system: Clear to auscultation. Respiratory effort normal. Cardiovascular system: S1 & S2 heard, RRR.  Gastrointestinal system: Abdomen is nondistended, soft and nontender. No  organomegaly or masses felt. Normal bowel sounds heard. Central nervous system: Alert and oriented. No focal neurological deficits. Extremities: Symmetric 5 x 5 power. Skin: No rashes, lesions Psychiatry: Judgement and insight appear normal. Mood & affect appropriate.   Data Reviewed: I have personally reviewed following labs and imaging studies  CBC:  Recent Labs Lab 05/10/16 2215 05/11/16 0524 05/12/16 0607  WBC 9.5 7.5 7.8  NEUTROABS 7.7  --   --   HGB 13.6 12.4* 13.0  HCT 39.4 36.7* 37.6*  MCV 82.9 83.6 82.3  PLT 193 176 981   Basic Metabolic Panel:  Recent Labs Lab 05/10/16 2215 05/11/16 0524 05/12/16 0607  NA 134* 137 135  K 3.6 3.5 3.1*  CL 99* 107 105  CO2 '23 23 23  ' GLUCOSE 171* 139* 143*  BUN '7 7 6  ' CREATININE 0.89 0.72 0.81  CALCIUM 9.4 8.3* 8.7*   GFR: Estimated Creatinine Clearance: 124.9 mL/min (by C-G formula based on SCr of 0.81 mg/dL). Liver Function Tests:  Recent Labs Lab 05/10/16 2215  AST 33  ALT 29  ALKPHOS 56  BILITOT 0.5  PROT 7.4  ALBUMIN 4.1   No results for input(s): LIPASE, AMYLASE in the last 168 hours. No results for input(s): AMMONIA in the last 168 hours. Coagulation Profile: No results for input(s): INR, PROTIME in the last 168 hours. Cardiac Enzymes:  Recent Labs Lab 05/10/16 2215  TROPONINI <0.03   BNP (last 3 results) No results for input(s): PROBNP in the last 8760 hours. HbA1C: No results for input(s): HGBA1C in the last 72 hours. CBG: No results for input(s): GLUCAP in the last 168 hours. Lipid Profile: No results for input(s): CHOL, HDL, LDLCALC, TRIG, CHOLHDL, LDLDIRECT in the last 72 hours. Thyroid Function Tests: No results for input(s): TSH, T4TOTAL, FREET4, T3FREE, THYROIDAB in the last 72 hours. Anemia Panel: No results for input(s): VITAMINB12, FOLATE, FERRITIN, TIBC, IRON, RETICCTPCT in the last 72 hours. Sepsis Labs:  Recent Labs Lab 05/10/16 2231 05/11/16 0059 05/11/16 0105 05/11/16 0524   PROCALCITON  --  <0.10  --   --   LATICACIDVEN 3.14*  --  3.1*  3.14* 2.2*    Recent Results (from the past 240 hour(s))  Culture, blood (Routine x 2)     Status: None (Preliminary result)   Collection Time: 05/10/16 10:15 PM  Result Value Ref Range Status   Specimen Description RIGHT ANTECUBITAL  Final   Special Requests BOTTLES DRAWN  AEROBIC AND ANAEROBIC 6CC EACH  Final   Culture NO GROWTH 2 DAYS  Final   Report Status PENDING  Incomplete  Culture, blood (Routine x 2)     Status: None (Preliminary result)   Collection Time: 05/10/16 10:26 PM  Result Value Ref Range Status   Specimen Description LEFT ANTECUBITAL  Final   Special Requests BOTTLES DRAWN AEROBIC AND ANAEROBIC [place]  Final   Culture NO GROWTH 2 DAYS  Final   Report Status PENDING  Incomplete     Radiology Studies: Dg Chest 2 View  Result Date: 05/10/2016 CLINICAL DATA:  Initial evaluation for acute shortness of breath. EXAM: CHEST  2 VIEW COMPARISON:  Prior CT and radiograph from 04/20/2016. FINDINGS: The cardiac and mediastinal silhouettes are stable in size and contour, and remain within normal limits. The lungs are normally inflated. No airspace consolidation, pleural effusion, or pulmonary edema is identified. There is no pneumothorax. No acute osseous abnormality identified. IMPRESSION: No active cardiopulmonary disease. Electronically Signed   By: Jeannine Boga M.D.   On: 05/10/2016 21:58   Ct Angio Chest Pe W Or Wo Contrast  Result Date: 05/11/2016 CLINICAL DATA:  Acute onset of shortness of breath, worsened with exertion. Fever. Initial encounter. EXAM: CT ANGIOGRAPHY CHEST WITH CONTRAST TECHNIQUE: Multidetector CT imaging of the chest was performed using the standard protocol during bolus administration of intravenous contrast. Multiplanar CT image reconstructions and MIPs were obtained to evaluate the vascular anatomy. CONTRAST:  100 mL of Isovue 370 IV contrast COMPARISON:  Chest radiograph performed  05/10/2016, and CTA of the chest performed 04/20/2016 FINDINGS: There is no evidence of pulmonary embolus. The lungs are clear bilaterally. There is no evidence of significant focal consolidation, pleural effusion or pneumothorax. No masses are identified; no abnormal focal contrast enhancement is seen. The mediastinum is unremarkable in appearance. No mediastinal lymphadenopathy seen. No pericardial effusion is identified. The great vessels are grossly unremarkable in appearance. No axillary lymphadenopathy is seen. The visualized portions of the thyroid gland are unremarkable in appearance. The visualized portions of the liver and spleen are unremarkable. The visualized portions of the pancreas, gallbladder, stomach, adrenal glands and kidneys are within normal limits. No acute osseous abnormalities are seen. Mild degenerative change is noted at the lower cervical spine. Review of the MIP images confirms the above findings. IMPRESSION: No evidence of pulmonary embolus.  Lungs clear bilaterally. Electronically Signed   By: Garald Balding M.D.   On: 05/11/2016 00:53    Scheduled Meds: . allopurinol  100 mg Oral Daily  . amLODipine  10 mg Oral Daily  . piperacillin-tazobactam (ZOSYN)  IV  3.375 g Intravenous Q8H  . potassium chloride  40 mEq Oral BID  . sodium chloride flush  3 mL Intravenous Q12H  . vancomycin  1,000 mg Intravenous Q12H   Continuous Infusions:     LOS: 1 day   Bethzy Hauck, Orpah Melter, MD [facility] Pager [phone]  If 7PM-7AM, please contact night-coverage [email or web address] Password [email or web address] 05/12/2016, 1:12 PM

## 2016-05-13 DIAGNOSIS — I10 Essential (primary) hypertension: Secondary | ICD-10-CM

## 2016-05-13 DIAGNOSIS — A419 Sepsis, unspecified organism: Principal | ICD-10-CM

## 2016-05-13 DIAGNOSIS — R509 Fever, unspecified: Secondary | ICD-10-CM

## 2016-05-13 DIAGNOSIS — E785 Hyperlipidemia, unspecified: Secondary | ICD-10-CM

## 2016-05-13 LAB — COMPREHENSIVE METABOLIC PANEL
ALBUMIN: 3.4 g/dL — AB (ref 3.5–5.0)
ALT: 22 U/L (ref 17–63)
AST: 27 U/L (ref 15–41)
Alkaline Phosphatase: 51 U/L (ref 38–126)
Anion gap: 9 (ref 5–15)
BUN: 8 mg/dL (ref 6–20)
CHLORIDE: 101 mmol/L (ref 101–111)
CO2: 23 mmol/L (ref 22–32)
CREATININE: 0.81 mg/dL (ref 0.61–1.24)
Calcium: 8.7 mg/dL — ABNORMAL LOW (ref 8.9–10.3)
GFR calc non Af Amer: >60 mL/min (ref >60)
GLUCOSE: 123 mg/dL — AB (ref 65–99)
Potassium: 3.1 mmol/L — ABNORMAL LOW (ref 3.5–5.1)
SODIUM: 133 mmol/L — AB (ref 135–145)
Total Bilirubin: 0.6 mg/dL (ref 0.3–1.2)
Total Protein: 6.4 g/dL — ABNORMAL LOW (ref 6.5–8.1)

## 2016-05-13 LAB — CBC
HCT: 38 % — ABNORMAL LOW (ref 39.0–52.0)
Hemoglobin: 13.3 g/dL (ref 13.0–17.0)
MCH: 28.5 pg (ref 26.0–34.0)
MCHC: 35 g/dL (ref 30.0–36.0)
MCV: 81.4 fL (ref 78.0–100.0)
PLATELETS: 165 10*3/uL (ref 150–400)
RBC: 4.67 MIL/uL (ref 4.22–5.81)
RDW: 12.5 % (ref 11.5–15.5)
WBC: 6.3 10*3/uL (ref 4.0–10.5)

## 2016-05-13 LAB — URINE CULTURE: Culture: <10000 — AB

## 2016-05-13 LAB — C DIFFICILE QUICK SCREEN W PCR REFLEX
C Diff antigen: NEGATIVE
C Diff interpretation: NOT DETECTED
C Diff toxin: NEGATIVE

## 2016-05-13 LAB — PROCALCITONIN: PROCALCITONIN: 0.12 ng/mL

## 2016-05-13 NOTE — Care Management Important Message (Signed)
Important Message  Patient Details  Name: Ian Roberson MRN: [id number] Date of Birth: 01-Jul-1951   Medicare Important Message Given:  Yes    Malcolm MetroChildress, Joelly Bolanos Demske, RN 05/13/2016, 11:06 AM

## 2016-05-13 NOTE — Progress Notes (Signed)
Patient ID: Ian Roberson, male   DOB: 1951-07-22, 65 y.o.   MRN: [id number]                                                                PROGRESS NOTE                                                                                                                                                                                                             Patient Demographics:    Ian Roberson, is a 65 y.o. male, DOB - 06/08/1951, NFA:[id number]  Admit date - 05/10/2016   Admitting Physician Haydee Monica, MD  Outpatient Primary MD for the patient is No PCP Per Patient  LOS - 2  Outpatient Specialists:  Chief Complaint  Patient presents with  . Shortness of Breath       Brief Narrative  65 y.o.malewith medical history significant of HTN, gout comes in with one day of not feeling well. Pt reports he woke up this am feeling "achy" and tired. He was normal when he went to bed. He did not eat breakfast as he was going to a big bbq later in the afternoon. He went to the BBQ at while there he started feeling worse like he was running a fever and having a lot of body aches. He denies any sick contacts. No n/v/d. No chest pain, no abdominal pain. No dysuria, change in urinary symptoms or hematuria. No rashes on his body. No pain on his skin anywhere. On arrival he was febrile over 102 and tachycardic. No cough. Denies sob (althought this is mentioned in the ED). He was covered with broad spectrum abx for possible sepsis. He feels back to normal now, after getting 3 liters of ivf, and tylenol, abx in the ED. Pt referred for admission for fever with lactic acid level of 3.65 y.o.malewith medical history significant of HTN, gout comes in with one day of not feeling well. Pt reports he woke up this am feeling "achy" and tired. He was normal when he went to bed. He did not eat breakfast as he was going to a big bbq later in the afternoon. He went to the BBQ at while there he started  feeling worse like he was running a fever and having a lot of body  aches. He denies any sick contacts. No n/v/d. No chest pain, no abdominal pain. No dysuria, change in urinary symptoms or hematuria. No rashes on his body. No pain on his skin anywhere. On arrival he was febrile over 102 and tachycardic. No cough. Denies sob (althought this is mentioned in the ED). He was covered with broad spectrum abx for possible sepsis. He feels back to normal now, after getting 3 liters of ivf, and tylenol, abx in the ED. Pt referred for admission for fever with lactic acid level of 3.   Subjective:    Ian Roberson today has been afebrile since yesterday afternoon   No headache, No chest pain, No abdominal pain - No Nausea, No new weakness tingling or numbness, No Cough - SOB.    Assessment  & Plan :    Principal Problem:   Fever Active Problems:   Essential hypertension   Hyperlipidemia   Malaise   Tachypnea   Sepsis (HCC)   1. 1.SIRS of unclear etiology 1. Patient afebrile this am 2. CXR unremarkable. UA reviewed and was unremarkable  CTA chest negative 3. Blood cultures obtained, NGTD 4. Patient has been continued on empiric broad spectrum IV abx 2. HTN 1. BP stable 2. Continue home regimen with exception of HCTZ given hx of gout 3. HLD 1. Seems stable 4. Weakness 1. Stable at present 2. Monitor for now 5. Hx gout 1. Patient reports prior gout in large R toe several years ago 2. Currently pt reports R knee pain and B shoulder pains stable 3. Continue allopurinol  DVT prophylaxis: SCD's Code Status: Full Family Communication: Pt in room, family at bedside Disposition Plan: Uncertain at this time  Lab Results  Component Value Date   PLT 165 05/13/2016    Antibiotics  :    vanco 9/3=> Zosyn 9/3=>  Anti-infectives    Start     Dose/Rate Route Frequency Ordered Stop   05/11/16 1300  vancomycin (VANCOCIN) IVPB 1000 mg/200 mL premix     1,000 mg 200 mL/hr over  60 Minutes Intravenous Every 12 hours 05/11/16 0826     05/11/16 0700  piperacillin-tazobactam (ZOSYN) IVPB 3.375 g     3.375 g 12.5 mL/hr over 240 Minutes Intravenous Every 8 hours 05/11/16 0233     05/10/16 2330  vancomycin (VANCOCIN) IVPB 1000 mg/200 mL premix     1,000 mg 200 mL/hr over 60 Minutes Intravenous Every 1 hr x 2 05/10/16 2231 05/11/16 0210   05/10/16 2230  piperacillin-tazobactam (ZOSYN) IVPB 3.375 g     3.375 g 100 mL/hr over 30 Minutes Intravenous  Once 05/10/16 2220 05/10/16 2322   05/10/16 2230  vancomycin (VANCOCIN) IVPB 1000 mg/200 mL premix  Status:  Discontinued     1,000 mg 200 mL/hr over 60 Minutes Intravenous  Once 05/10/16 2220 05/10/16 2231        Objective:   Vitals:   05/12/16 1828 05/12/16 1930 05/12/16 2026 05/13/16 0624  BP:   (!) 150/87 135/72  Pulse:   90 85  Resp:   18 18  Temp: (!) 100.6 F (38.1 C)  100.3 F (37.9 C) 99.7 F (37.6 C)  TempSrc: Oral  Oral Oral  SpO2:  94% 96% 98%  Weight:      Height:        Wt Readings from Last 3 Encounters:  05/11/16 113.2 kg (249 lb 9.6 oz)  04/20/16 106.6 kg (235 lb)  04/05/16 108 kg (238 lb)     Intake/Output Summary (  Last 24 hours) at 05/13/16 0740 Last data filed at 05/13/16 [id number]  Gross per 24 hour  Intake             1270 ml  Output                1 ml  Net             1269 ml     Physical Exam  Awake Alert, Oriented X 3, No new F.N deficits, Normal affect .AT,PERRAL Supple Neck,No JVD, No cervical lymphadenopathy appriciated.  Symmetrical Chest wall movement, Good air movement bilaterally, CTAB RRR,No Gallops,Rubs or new Murmurs, No Parasternal Heave +ve B.Sounds, Abd Soft, No tenderness, No organomegaly appriciated, No rebound - guarding or rigidity. No Cyanosis, Clubbing or edema, No new Rash or bruise      Data Review:    CBC  Recent Labs Lab 05/10/16 2215 05/11/16 0524 05/12/16 0607 05/13/16 0559  WBC 9.5 7.5 7.8 6.3  HGB 13.6 12.4* 13.0 13.3  HCT 39.4 36.7*  37.6* 38.0*  PLT 193 176 168 165  MCV 82.9 83.6 82.3 81.4  MCH 28.6 28.2 28.4 28.5  MCHC 34.5 33.8 34.6 35.0  RDW 13.4 13.6 12.9 12.5  LYMPHSABS 1.1  --   --   --   MONOABS 0.7  --   --   --   EOSABS 0.0  --   --   --   BASOSABS 0.0  --   --   --     Chemistries   Recent Labs Lab 05/10/16 2215 05/11/16 0524 05/12/16 0607 05/13/16 0559  NA 134* 137 135 133*  K 3.6 3.5 3.1* 3.1*  CL 99* 107 105 101  CO2 23 23 23 23   GLUCOSE 171* 139* 143* 123*  BUN 7 7 6 8   CREATININE 0.89 0.72 0.81 0.81  CALCIUM 9.4 8.3* 8.7* 8.7*  AST 33  --   --  27  ALT 29  --   --  22  ALKPHOS 56  --   --  51  BILITOT 0.5  --   --  0.6   ------------------------------------------------------------------------------------------------------------------ No results for input(s): CHOL, HDL, LDLCALC, TRIG, CHOLHDL, LDLDIRECT in the last 72 hours.  No results found for: HGBA1C ------------------------------------------------------------------------------------------------------------------ No results for input(s): TSH, T4TOTAL, T3FREE, THYROIDAB in the last 72 hours.  Invalid input(s): FREET3 ------------------------------------------------------------------------------------------------------------------ No results for input(s): VITAMINB12, FOLATE, FERRITIN, TIBC, IRON, RETICCTPCT in the last 72 hours.  Coagulation profile No results for input(s): INR, PROTIME in the last 168 hours.  No results for input(s): DDIMER in the last 72 hours.  Cardiac Enzymes  Recent Labs Lab 05/10/16 2215  TROPONINI <0.03   ------------------------------------------------------------------------------------------------------------------    Component Value Date/Time   BNP 16.0 05/10/2016 2215    Inpatient Medications  Scheduled Meds: . allopurinol  100 mg Oral Daily  . amLODipine  10 mg Oral Daily  . piperacillin-tazobactam (ZOSYN)  IV  3.375 g Intravenous Q8H  . sodium chloride flush  3 mL Intravenous Q12H    . vancomycin  1,000 mg Intravenous Q12H   Continuous Infusions:  PRN Meds:.acetaminophen **OR** acetaminophen, HYDROcodone-acetaminophen, ondansetron **OR** ondansetron (ZOFRAN) IV  Micro Results Recent Results (from the past 240 hour(s))  Culture, blood (Routine x 2)     Status: None (Preliminary result)   Collection Time: 05/10/16 10:15 PM  Result Value Ref Range Status   Specimen Description RIGHT ANTECUBITAL  Final   Special Requests BOTTLES DRAWN AEROBIC AND ANAEROBIC [facility] EACH  Final  Culture NO GROWTH 2 DAYS  Final   Report Status PENDING  Incomplete  Culture, blood (Routine x 2)     Status: None (Preliminary result)   Collection Time: 05/10/16 10:26 PM  Result Value Ref Range Status   Specimen Description LEFT ANTECUBITAL  Final   Special Requests BOTTLES DRAWN AEROBIC AND ANAEROBIC [facility] EACH  Final   Culture NO GROWTH 2 DAYS  Final   Report Status PENDING  Incomplete    Radiology Reports Dg Chest 2 View  Result Date: 05/10/2016 CLINICAL DATA:  Initial evaluation for acute shortness of breath. EXAM: CHEST  2 VIEW COMPARISON:  Prior CT and radiograph from 04/20/2016. FINDINGS: The cardiac and mediastinal silhouettes are stable in size and contour, and remain within normal limits. The lungs are normally inflated. No airspace consolidation, pleural effusion, or pulmonary edema is identified. There is no pneumothorax. No acute osseous abnormality identified. IMPRESSION: No active cardiopulmonary disease. Electronically Signed   By: Rise Mu M.D.   On: 05/10/2016 21:58   Dg Chest 2 View  Result Date: 04/20/2016 CLINICAL DATA:  Acute onset shortness of breath this morning. Hypertension. EXAM: CHEST  2 VIEW COMPARISON:  08/05/2015 FINDINGS: The heart size and mediastinal contours are within normal limits. Both lungs are clear. The visualized skeletal structures are unremarkable. IMPRESSION: Stable exam.  No active cardiopulmonary disease. Electronically Signed   By: Myles Rosenthal M.D.   On: 04/20/2016 11:55   Ct Angio Chest Pe W Or Wo Contrast  Result Date: 05/11/2016 CLINICAL DATA:  Acute onset of shortness of breath, worsened with exertion. Fever. Initial encounter. EXAM: CT ANGIOGRAPHY CHEST WITH CONTRAST TECHNIQUE: Multidetector CT imaging of the chest was performed using the standard protocol during bolus administration of intravenous contrast. Multiplanar CT image reconstructions and MIPs were obtained to evaluate the vascular anatomy. CONTRAST:  100 mL of Isovue 370 IV contrast COMPARISON:  Chest radiograph performed 05/10/2016, and CTA of the chest performed 04/20/2016 FINDINGS: There is no evidence of pulmonary embolus. The lungs are clear bilaterally. There is no evidence of significant focal consolidation, pleural effusion or pneumothorax. No masses are identified; no abnormal focal contrast enhancement is seen. The mediastinum is unremarkable in appearance. No mediastinal lymphadenopathy seen. No pericardial effusion is identified. The great vessels are grossly unremarkable in appearance. No axillary lymphadenopathy is seen. The visualized portions of the thyroid gland are unremarkable in appearance. The visualized portions of the liver and spleen are unremarkable. The visualized portions of the pancreas, gallbladder, stomach, adrenal glands and kidneys are within normal limits. No acute osseous abnormalities are seen. Mild degenerative change is noted at the lower cervical spine. Review of the MIP images confirms the above findings. IMPRESSION: No evidence of pulmonary embolus.  Lungs clear bilaterally. Electronically Signed   By: Roanna Raider M.D.   On: 05/11/2016 00:53   Ct Angio Chest Pe W And/or Wo Contrast  Result Date: 04/20/2016 CLINICAL DATA:  Shortness of breath on exertion EXAM: CT ANGIOGRAPHY CHEST WITH CONTRAST TECHNIQUE: Multidetector CT imaging of the chest was performed using the standard protocol during bolus administration of intravenous contrast.  Multiplanar CT image reconstructions and MIPs were obtained to evaluate the vascular anatomy. CONTRAST:  100 mL Isovue 370. COMPARISON:  Plain film from earlier in the same day FINDINGS: Mediastinum/Lymph Nodes: Thoracic inlet is within normal limits. No hilar or mediastinal adenopathy is identified. Cardiovascular: Thoracic aorta is within normal limits. No dissection or aneurysmal dilatation is seen. The pulmonary artery demonstrates a normal branching  pattern without evidence of pulmonary emboli. No acute abnormality in the cardiac structures is noted. Lungs/Pleura: No pulmonary mass, infiltrate, or effusion. Upper abdomen: No acute findings. Musculoskeletal: No chest wall mass or suspicious bone lesions identified. Review of the MIP images confirms the above findings. IMPRESSION: No acute abnormality noted. Electronically Signed   By: Alcide Clever M.D.   On: 04/20/2016 15:23    Time Spent in minutes  30   Pearson Grippe M.D on 05/13/2016 at 7:40 AM  Between 7am to 7pm - Pager - [phone]  After 7pm go to [email or web address] - password [facility]  [facility] -  Office  [phone]

## 2016-05-14 DIAGNOSIS — R0682 Tachypnea, not elsewhere classified: Secondary | ICD-10-CM

## 2016-05-14 LAB — COMPREHENSIVE METABOLIC PANEL
ALT: 35 U/L (ref 17–63)
AST: 43 U/L — AB (ref 15–41)
Albumin: 3.5 g/dL (ref 3.5–5.0)
Alkaline Phosphatase: 60 U/L (ref 38–126)
Anion gap: 9 (ref 5–15)
BILIRUBIN TOTAL: 0.6 mg/dL (ref 0.3–1.2)
BUN: 8 mg/dL (ref 6–20)
CALCIUM: 8.8 mg/dL — AB (ref 8.9–10.3)
CO2: 23 mmol/L (ref 22–32)
CREATININE: 1.02 mg/dL (ref 0.61–1.24)
Chloride: 104 mmol/L (ref 101–111)
GFR calc Af Amer: >60 mL/min (ref >60)
Glucose, Bld: 113 mg/dL — ABNORMAL HIGH (ref 65–99)
Potassium: 3.1 mmol/L — ABNORMAL LOW (ref 3.5–5.1)
Sodium: 136 mmol/L (ref 135–145)
TOTAL PROTEIN: 6.9 g/dL (ref 6.5–8.1)

## 2016-05-14 LAB — CBC
HEMATOCRIT: 41.2 % (ref 39.0–52.0)
Hemoglobin: 14.5 g/dL (ref 13.0–17.0)
MCH: 28.9 pg (ref 26.0–34.0)
MCHC: 35.2 g/dL (ref 30.0–36.0)
MCV: 82.2 fL (ref 78.0–100.0)
Platelets: 182 10*3/uL (ref 150–400)
RBC: 5.01 MIL/uL (ref 4.22–5.81)
RDW: 13.2 % (ref 11.5–15.5)
WBC: 7.3 10*3/uL (ref 4.0–10.5)

## 2016-05-14 MED ORDER — LEVOFLOXACIN 500 MG PO TABS
500.0000 mg | ORAL_TABLET | Freq: Every day | ORAL | Status: DC
  Administered 2016-05-14 – 2016-05-15 (×2): 500 mg via ORAL
  Filled 2016-05-14 (×2): qty 1

## 2016-05-14 MED ORDER — POTASSIUM CHLORIDE CRYS ER 20 MEQ PO TBCR
40.0000 meq | EXTENDED_RELEASE_TABLET | Freq: Every day | ORAL | Status: DC
  Administered 2016-05-14 – 2016-05-15 (×2): 40 meq via ORAL
  Filled 2016-05-14 (×2): qty 2

## 2016-05-14 NOTE — Progress Notes (Signed)
Pharmacy Antibiotic Note  Ian MemoJasper Roberson is a 65 y.o. male admitted on 05/10/2016 with sepsis.  Pharmacy has been consulted for vancomycin and zosyn dosing.  Plan:  Continue Vancomycin 1000mg  IV q12h Check trough at steady state Continue Zosyn 3.375gm IV q8h, EID Monitor labs, renal fxn, progress and c/s Deescalate ABX when improved / appropriate.    Height: 6\' 2"  (188 cm) Weight: 249 lb 9.6 oz (113.2 kg) IBW/kg (Calculated) : 82.2  Temp (24hrs), Avg:98.8 F (37.1 C), Min:98.2 F (36.8 C), Max:99.6 F (37.6 C)   Recent Labs Lab 05/10/16 2215 05/10/16 2231 05/11/16 0105 05/11/16 0524 05/12/16 0607 05/13/16 0559 05/14/16 0551  WBC 9.5  --   --  7.5 7.8 6.3 7.3  CREATININE 0.89  --   --  0.72 0.81 0.81 1.02  LATICACIDVEN  --  3.14* 3.1*  3.14* 2.2*  --   --   --     Estimated Creatinine Clearance: 99.2 mL/min (by C-G formula based on SCr of 1.02 mg/dL).    Allergies  Allergen Reactions  . Penicillins Other (See Comments)    Convulsions Has patient had a PCN reaction causing immediate rash, facial/tongue/throat swelling, SOB or lightheadedness with hypotension: No Has patient had a PCN reaction causing severe rash involving mucus membranes or skin necrosis: No Has patient had a PCN reaction that required hospitalization No Has patient had a PCN reaction occurring within the last 10 years: No If all of the above answers are "NO", then may proceed with Cephalosporin use.    Antimicrobials this admission: Vancomycin 9/4 >>  Zosyn 9/4 >>   Dose adjustments this admission: n/a  Microbiology results: 9/4 BCx: pending 9/4 UCx: pending  Thank you for allowing pharmacy to be a part of this patient's care.  Valrie HartScott Johnnae Impastato, PharmD Clinical Pharmacist Pager:  [phone] 05/14/2016

## 2016-05-14 NOTE — Progress Notes (Addendum)
PROGRESS NOTE    Ian Roberson  EAV:[id number] DOB: 08-19-1951 DOA: 05/10/2016 PCP: No PCP Per Patient    Brief Narrative:  65 y.o.malewith medical history significant of HTN, gout comes in with one day of not feeling well. Pt reports he woke up this am feeling "achy" and tired. He was normal when he went to bed. He did not eat breakfast as he was going to a big bbq later in the afternoon. He went to the BBQ at while there he started feeling worse like he was running a fever and having a lot of body aches. He denies any sick contacts. No n/v/d. No chest pain, no abdominal pain. No dysuria, change in urinary symptoms or hematuria. No rashes on his body. No pain on his skin anywhere. On arrival he was febrile over 102 and tachycardic. No cough. Denies sob (althought this is mentioned in the ED). He was covered with broad spectrum abx for possible sepsis. He feels back to normal now, after getting 3 liters of ivf, and tylenol, abx in the ED. Pt referred for admission for fever with lactic acid level of 3.65 y.o.malewith medical history significant of HTN, gout comes in with one day of not feeling well. Pt reports he woke up this am feeling "achy" and tired. He was normal when he went to bed. He did not eat breakfast as he was going to a big bbq later in the afternoon. He went to the BBQ at while there he started feeling worse like he was running a fever and having a lot of body aches. He denies any sick contacts. No n/v/d. No chest pain, no abdominal pain. No dysuria, change in urinary symptoms or hematuria. No rashes on his body. No pain on his skin anywhere. On arrival he was febrile over 102 and tachycardic. No cough. Denies sob (althought this is mentioned in the ED). He was covered with broad spectrum abx for possible sepsis. He feels back to normal now, after getting 3 liters of ivf, and tylenol, abx in the ED. Pt referred for admission for fever with lactic acid level  of 3.   Assessment & Plan:   Principal Problem:   Fever Active Problems:   Essential hypertension   Hyperlipidemia   Malaise   Tachypnea   Sepsis (HCC)   SIRS of unclear etiology - Patient feeling better today than yesterday - CXR unremarkable. UA reviewed and was unremarkable  CTA chest negative - Blood cultures obtained, NGTD - Patient has been continued on empiric broad spectrum IV abx - repeat procalcitonin in am- if less than 0.1 can stop antibiotics - stop IV antibiotic and transition to PO  HTN - BP stable - Continue home regimen with exception of HCTZ given hx of gout  Hypokalemia - will replace with 40mEq KCl PO - recheck BMP in am  HLD - Seems stable  Weakness - improved from yesterday per patient   Hx gout - Patient reports prior gout in large R toe several years ago - Currently pt reports R knee pain and B shoulder pains stable - Continue allopurinol   DVT prophylaxis: SCD's Code Status: Full Family Communication: Patient's wife and niece bedside Disposition Plan: Will transition to PO antibiotics and monitor- if remains medically stable can anticipate discharge in 24-48 hours   Consultants:   none  Procedures:   none  Antimicrobials:   Vancomycin and Zosyn (9/3- 9/7)  Augmentin started on 9/7   Subjective: Patient voices that he feels markedly better  today.  He voices he has more energy and feels more like himself.  He denies fever, chest pain, cough, dysuria, abdominal pain, diarrhea, constipation, shortness of breath, headache, confusion.  Reports he is eating well.  Asked about etiology of infection.  No pain noted.  Patient states he is trying to ambulate small amounts in his room to keep his strength up.   Objective: Vitals:   05/13/16 1934 05/13/16 2305 05/14/16 0553 05/14/16 0854  BP:  135/83 127/74 118/76  Pulse:  82 77 82  Resp:  20 14 16   Temp:  98.8 F (37.1 C) 98.4 F (36.9 C) 98.2 F (36.8 C)  TempSrc:  Oral Oral  Oral  SpO2: 96% 99% 99% 98%  Weight:      Height:        Intake/Output Summary (Last 24 hours) at 05/14/16 1436 Last data filed at 05/14/16 1300  Gross per 24 hour  Intake              920 ml  Output              600 ml  Net              320 ml   Filed Weights   05/10/16 2120 05/11/16 0234  Weight: 108 kg (238 lb) 113.2 kg (249 lb 9.6 oz)    Examination:  General exam: Appears calm and comfortable  Respiratory system: Clear to auscultation. Respiratory effort normal. Cardiovascular system: S1 & S2 heard, RRR. No JVD, murmurs, rubs, gallops or clicks. No pedal edema. Gastrointestinal system: Abdomen is nondistended, soft and nontender. No organomegaly or masses felt. Normal bowel sounds heard. Central nervous system: Alert and oriented. No focal neurological deficits. Extremities: Symmetric 5 x 5 power. Skin: No rashes, or ulcers; scratch like lesion patient reports from a lawn solar light on left shin that is healing Psychiatry: Judgement and insight appear normal. Mood & affect appropriate.     Data Reviewed: I have personally reviewed following labs and imaging studies  CBC:  Recent Labs Lab 05/10/16 2215 05/11/16 0524 05/12/16 0607 05/13/16 0559 05/14/16 0551  WBC 9.5 7.5 7.8 6.3 7.3  NEUTROABS 7.7  --   --   --   --   HGB 13.6 12.4* 13.0 13.3 14.5  HCT 39.4 36.7* 37.6* 38.0* 41.2  MCV 82.9 83.6 82.3 81.4 82.2  PLT 193 176 168 165 182   Basic Metabolic Panel:  Recent Labs Lab 05/10/16 2215 05/11/16 0524 05/12/16 0607 05/13/16 0559 05/14/16 0551  NA 134* 137 135 133* 136  K 3.6 3.5 3.1* 3.1* 3.1*  CL 99* 107 105 101 104  CO2 23 23 23 23 23   GLUCOSE 171* 139* 143* 123* 113*  BUN 7 7 6 8 8   CREATININE 0.89 0.72 0.81 0.81 1.02  CALCIUM 9.4 8.3* 8.7* 8.7* 8.8*   GFR: Estimated Creatinine Clearance: 99.2 mL/min (by C-G formula based on SCr of 1.02 mg/dL). Liver Function Tests:  Recent Labs Lab 05/10/16 2215 05/13/16 0559 05/14/16 0551  AST 33  27 43*  ALT 29 22 35  ALKPHOS 56 51 60  BILITOT 0.5 0.6 0.6  PROT 7.4 6.4* 6.9  ALBUMIN 4.1 3.4* 3.5   No results for input(s): LIPASE, AMYLASE in the last 168 hours. No results for input(s): AMMONIA in the last 168 hours. Coagulation Profile: No results for input(s): INR, PROTIME in the last 168 hours. Cardiac Enzymes:  Recent Labs Lab 05/10/16 2215  TROPONINI <0.03   BNP (last  3 results) No results for input(s): PROBNP in the last 8760 hours. HbA1C: No results for input(s): HGBA1C in the last 72 hours. CBG: No results for input(s): GLUCAP in the last 168 hours. Lipid Profile: No results for input(s): CHOL, HDL, LDLCALC, TRIG, CHOLHDL, LDLDIRECT in the last 72 hours. Thyroid Function Tests: No results for input(s): TSH, T4TOTAL, FREET4, T3FREE, THYROIDAB in the last 72 hours. Anemia Panel: No results for input(s): VITAMINB12, FOLATE, FERRITIN, TIBC, IRON, RETICCTPCT in the last 72 hours. Sepsis Labs:  Recent Labs Lab 05/10/16 2231 05/11/16 0059 05/11/16 0105 05/11/16 0524 05/13/16 0559  PROCALCITON  --  <0.10  --   --  0.12  LATICACIDVEN 3.14*  --  3.1*  3.14* 2.2*  --     Recent Results (from the past 240 hour(s))  Culture, blood (Routine x 2)     Status: None (Preliminary result)   Collection Time: 05/10/16 10:15 PM  Result Value Ref Range Status   Specimen Description BLOOD RIGHT ANTECUBITAL DRAWN BY RN  Final   Special Requests BOTTLES DRAWN AEROBIC AND ANAEROBIC 6CC EACH  Final   Culture NO GROWTH 4 DAYS  Final   Report Status PENDING  Incomplete  Culture, blood (Routine x 2)     Status: None (Preliminary result)   Collection Time: 05/10/16 10:26 PM  Result Value Ref Range Status   Specimen Description BLOOD LEFT ANTECUBITAL DRAWN BY RN  Final   Special Requests BOTTLES DRAWN AEROBIC AND ANAEROBIC 6CC EACH  Final   Culture NO GROWTH 4 DAYS  Final   Report Status PENDING  Incomplete  Urine culture     Status: Abnormal   Collection Time: 05/10/16 11:10  PM  Result Value Ref Range Status   Specimen Description URINE, RANDOM  Final   Special Requests NONE  Final   Culture (A)  Final    <10,000 COLONIES/mL INSIGNIFICANT GROWTH Performed at [facility]    Report Status 05/13/2016 FINAL  Final  C difficile quick scan w PCR reflex     Status: None   Collection Time: 05/13/16  2:38 PM  Result Value Ref Range Status   C Diff antigen NEGATIVE NEGATIVE Final   C Diff toxin NEGATIVE NEGATIVE Final   C Diff interpretation No C. difficile detected.  Final         Radiology Studies: No results found.      Scheduled Meds: . allopurinol  100 mg Oral Daily  . amLODipine  10 mg Oral Daily  . piperacillin-tazobactam (ZOSYN)  IV  3.375 g Intravenous Q8H  . sodium chloride flush  3 mL Intravenous Q12H  . vancomycin  1,000 mg Intravenous Q12H   Continuous Infusions:    LOS: 3 days    Time spent: 25 minutes    Bennett Scrape, MD [facility] Pager [phone]  If 7PM-7AM, please contact night-coverage [email or web address] Password [email or web address] 05/14/2016, 2:36 PM

## 2016-05-15 LAB — BASIC METABOLIC PANEL
Anion gap: 8 (ref 5–15)
BUN: 8 mg/dL (ref 6–20)
CHLORIDE: 107 mmol/L (ref 101–111)
CO2: 24 mmol/L (ref 22–32)
Calcium: 8.9 mg/dL (ref 8.9–10.3)
Creatinine, Ser: 0.98 mg/dL (ref 0.61–1.24)
GFR calc Af Amer: >60 mL/min (ref >60)
GFR calc non Af Amer: >60 mL/min (ref >60)
GLUCOSE: 116 mg/dL — AB (ref 65–99)
POTASSIUM: 3.3 mmol/L — AB (ref 3.5–5.1)
SODIUM: 139 mmol/L (ref 135–145)

## 2016-05-15 LAB — CULTURE, BLOOD (ROUTINE X 2)
CULTURE: NO GROWTH
CULTURE: NO GROWTH

## 2016-05-15 LAB — PROCALCITONIN: Procalcitonin: 0.17 ng/mL

## 2016-05-15 LAB — FECAL LACTOFERRIN, QUANT: Lactoferrin, Fecal, Quant.: 8.65 ug/mL(g) — ABNORMAL HIGH (ref 0.00–7.24)

## 2016-05-15 MED ORDER — LEVOFLOXACIN 500 MG PO TABS: 500.0000 mg | ORAL_TABLET | Freq: Every day | ORAL | 0 refills | Status: AC

## 2016-05-15 NOTE — Discharge Summary (Addendum)
Physician Discharge Summary  Lyndel Sarate ZOX:[id number] DOB: 1950-09-16 DOA: 05/10/2016  PCP: No PCP Per Patient  Admit date: 05/10/2016 Discharge date: 05/15/2016  Admitted From: Home Disposition:  Home  Recommendations for Outpatient Follow-up:  1. Follow up with PCP in 1-2 weeks 2. Take antibiotics as prescribed for the next week  Home Health:No Equipment/Devices:None  Discharge Condition:Stable and improved CODE STATUS:Full Diet recommendation: Regular  Brief/Interim Summary: 65 y.o.malewith medical history significant of HTN, gout comes in with one day of not feeling well. Pt reports he woke up this am feeling "achy" and tired. He was normal when he went to bed. He did not eat breakfast as he was going to a big bbq later in the afternoon. He went to the BBQ at while there he started feeling worse like he was running a fever and having a lot of body aches. He denies any sick contacts. No n/v/d. No chest pain, no abdominal pain. No dysuria, change in urinary symptoms or hematuria. No rashes on his body. No pain on his skin anywhere. On arrival he was febrile over 102 and tachycardic. No cough. Denies sob (althought this is mentioned in the ED). He was covered with broad spectrum abx for possible sepsis. He feels back to normal now, after getting 3 liters of ivf, and tylenol, abx in the ED. Pt referred for admission for fever with lactic acid level of 3.65 y.o.malewith medical history significant of HTN, gout comes in with one day of not feeling well. Pt reports he woke up this am feeling "achy" and tired. He was normal when he went to bed. He did not eat breakfast as he was going to a big bbq later in the afternoon. He went to the BBQ at while there he started feeling worse like he was running a fever and having a lot of body aches. He denies any sick contacts. No n/v/d. No chest pain, no abdominal pain. No dysuria, change in urinary symptoms or hematuria. No  rashes on his body. No pain on his skin anywhere. On arrival he was febrile over 102 and tachycardic. No cough. Denies sob (althought this is mentioned in the ED). He was covered with broad spectrum abx for possible sepsis. He feels back to normal now, after getting 3 liters of ivf, and tylenol, abx in the ED. Pt referred for admission for fever with lactic acid level of 3.  Discharge Diagnoses:  Principal Problem:   Fever Active Problems:   Essential hypertension   Hyperlipidemia   Malaise   Tachypnea   Sepsis (HCC)  SIRS of unclear etiology - Patient feeling better today than yesterday - CXR unremarkable. UA reviewed and was unremarkable CTA chest negative - Blood cultures obtained, NGTD - Patient has been continued on empiric broad spectrum IV abx - will discharge home on levaquin  HTN - BP stable - Continue home regimen with exception of HCTZ given hx of gout  Hypokalemia - will replace with KCl PO  HLD - Seems stable  Weakness - improved from yesterday per patient   Hx gout - Patient reports prior gout in large R toe several years ago - Currently pt reports R knee pain and B shoulder pains stable - Continue allopurinol  Discharge Instructions  Discharge Instructions    Call MD for:  difficulty breathing, headache or visual disturbances    Complete by:  As directed   Call MD for:  extreme fatigue    Complete by:  As directed  Call MD for:  persistant nausea and vomiting    Complete by:  As directed   Call MD for:  temperature >100.4    Complete by:  As directed   Diet - low sodium heart healthy    Complete by:  As directed   Increase activity slowly    Complete by:  As directed       Medication List    TAKE these medications   allopurinol 100 MG tablet Commonly known as:  ZYLOPRIM Take 100 mg by mouth daily.   amLODipine 10 MG tablet Commonly known as:  NORVASC Take 10 mg by mouth daily.   HYDROcodone-acetaminophen 10-325 MG  tablet Commonly known as:  NORCO Take 1 tablet by mouth every 6 (six) hours as needed for moderate pain.   levofloxacin 500 MG tablet Commonly known as:  LEVAQUIN Take 1 tablet (500 mg total) by mouth daily.   Vitamin D 2000 units Caps Take 1 capsule by mouth daily.      Follow-up Information    DYER,CHRISTOPHER, MD. Call in 1 week(s).   Specialty:  Unknown Physician Specialty Why:  [phone] to make appointment Contact information: [address] [place] [address] [phone]          Allergies  Allergen Reactions  . Penicillins Other (See Comments)    Convulsions Has patient had a PCN reaction causing immediate rash, facial/tongue/throat swelling, SOB or lightheadedness with hypotension: No Has patient had a PCN reaction causing severe rash involving mucus membranes or skin necrosis: No Has patient had a PCN reaction that required hospitalization No Has patient had a PCN reaction occurring within the last 10 years: No If all of the above answers are "NO", then may proceed with Cephalosporin use.     Consultations:  None   Procedures/Studies: Dg Chest 2 View  Result Date: 05/10/2016 CLINICAL DATA:  Initial evaluation for acute shortness of breath. EXAM: CHEST  2 VIEW COMPARISON:  Prior CT and radiograph from 04/20/2016. FINDINGS: The cardiac and mediastinal silhouettes are stable in size and contour, and remain within normal limits. The lungs are normally inflated. No airspace consolidation, pleural effusion, or pulmonary edema is identified. There is no pneumothorax. No acute osseous abnormality identified. IMPRESSION: No active cardiopulmonary disease. Electronically Signed   By: Rise Mu M.D.   On: 05/10/2016 21:58   Dg Chest 2 View  Result Date: 04/20/2016 CLINICAL DATA:  Acute onset shortness of breath this morning. Hypertension. EXAM: CHEST  2 VIEW COMPARISON:  08/05/2015 FINDINGS: The heart size and mediastinal contours are within  normal limits. Both lungs are clear. The visualized skeletal structures are unremarkable. IMPRESSION: Stable exam.  No active cardiopulmonary disease. Electronically Signed   By: Myles Rosenthal M.D.   On: 04/20/2016 11:55   Ct Angio Chest Pe W Or Wo Contrast  Result Date: 05/11/2016 CLINICAL DATA:  Acute onset of shortness of breath, worsened with exertion. Fever. Initial encounter. EXAM: CT ANGIOGRAPHY CHEST WITH CONTRAST TECHNIQUE: Multidetector CT imaging of the chest was performed using the standard protocol during bolus administration of intravenous contrast. Multiplanar CT image reconstructions and MIPs were obtained to evaluate the vascular anatomy. CONTRAST:  100 mL of Isovue 370 IV contrast COMPARISON:  Chest radiograph performed 05/10/2016, and CTA of the chest performed 04/20/2016 FINDINGS: There is no evidence of pulmonary embolus. The lungs are clear bilaterally. There is no evidence of significant focal consolidation, pleural effusion or pneumothorax. No masses are identified; no abnormal focal contrast enhancement is seen.  The mediastinum is unremarkable in appearance. No mediastinal lymphadenopathy seen. No pericardial effusion is identified. The great vessels are grossly unremarkable in appearance. No axillary lymphadenopathy is seen. The visualized portions of the thyroid gland are unremarkable in appearance. The visualized portions of the liver and spleen are unremarkable. The visualized portions of the pancreas, gallbladder, stomach, adrenal glands and kidneys are within normal limits. No acute osseous abnormalities are seen. Mild degenerative change is noted at the lower cervical spine. Review of the MIP images confirms the above findings. IMPRESSION: No evidence of pulmonary embolus.  Lungs clear bilaterally. Electronically Signed   By: Roanna Raider M.D.   On: 05/11/2016 00:53   Ct Angio Chest Pe W And/or Wo Contrast  Result Date: 04/20/2016 CLINICAL DATA:  Shortness of breath on exertion  EXAM: CT ANGIOGRAPHY CHEST WITH CONTRAST TECHNIQUE: Multidetector CT imaging of the chest was performed using the standard protocol during bolus administration of intravenous contrast. Multiplanar CT image reconstructions and MIPs were obtained to evaluate the vascular anatomy. CONTRAST:  100 mL Isovue 370. COMPARISON:  Plain film from earlier in the same day FINDINGS: Mediastinum/Lymph Nodes: Thoracic inlet is within normal limits. No hilar or mediastinal adenopathy is identified. Cardiovascular: Thoracic aorta is within normal limits. No dissection or aneurysmal dilatation is seen. The pulmonary artery demonstrates a normal branching pattern without evidence of pulmonary emboli. No acute abnormality in the cardiac structures is noted. Lungs/Pleura: No pulmonary mass, infiltrate, or effusion. Upper abdomen: No acute findings. Musculoskeletal: No chest wall mass or suspicious bone lesions identified. Review of the MIP images confirms the above findings. IMPRESSION: No acute abnormality noted. Electronically Signed   By: Alcide Clever M.D.   On: 04/20/2016 15:23    (Echo, Carotid, EGD, Colonoscopy, ERCP)    Subjective:   Discharge Exam: Vitals:   05/14/16 2123 05/15/16 0638  BP: 139/85   Pulse: 86 (!) 104  Resp: 20 15  Temp: 98.7 F (37.1 C) 99 F (37.2 C)   Vitals:   05/14/16 1455 05/14/16 1937 05/14/16 2123 05/15/16 0638  BP: 138/85  139/85   Pulse: 83  86 (!) 104  Resp: 18  20 15   Temp: 98.5 F (36.9 C)  98.7 F (37.1 C) 99 F (37.2 C)  TempSrc: Tympanic  Oral Oral  SpO2: 99% 98% 99% 93%  Weight:      Height:        General: Pt is alert, awake, not in acute distress Cardiovascular: RRR, S1/S2 +, no rubs, no gallops Respiratory: CTA bilaterally, no wheezing, no rhonchi Abdominal: Soft, NT, ND, bowel sounds + Extremities: no edema, no cyanosis, SCD's in place    The results of significant diagnostics from this hospitalization (including imaging, microbiology, ancillary and  laboratory) are listed below for reference.     Microbiology: Recent Results (from the past 240 hour(s))  Culture, blood (Routine x 2)     Status: None   Collection Time: 05/10/16 10:15 PM  Result Value Ref Range Status   Specimen Description BLOOD RIGHT ANTECUBITAL DRAWN BY RN  Final   Special Requests BOTTLES DRAWN AEROBIC AND ANAEROBIC 6CC EACH  Final   Culture NO GROWTH 5 DAYS  Final   Report Status 05/15/2016 FINAL  Final  Culture, blood (Routine x 2)     Status: None   Collection Time: 05/10/16 10:26 PM  Result Value Ref Range Status   Specimen Description BLOOD LEFT ANTECUBITAL DRAWN BY RN  Final   Special Requests BOTTLES DRAWN AEROBIC AND ANAEROBIC  [facility] EACH  Final   Culture NO GROWTH 5 DAYS  Final   Report Status 05/15/2016 FINAL  Final  Urine culture     Status: Abnormal   Collection Time: 05/10/16 11:10 PM  Result Value Ref Range Status   Specimen Description URINE, RANDOM  Final   Special Requests NONE  Final   Culture (A)  Final    <10,000 COLONIES/mL INSIGNIFICANT GROWTH Performed at [facility]    Report Status 05/13/2016 FINAL  Final  C difficile quick scan w PCR reflex     Status: None   Collection Time: 05/13/16  2:38 PM  Result Value Ref Range Status   C Diff antigen NEGATIVE NEGATIVE Final   C Diff toxin NEGATIVE NEGATIVE Final   C Diff interpretation No C. difficile detected.  Final     Labs: BNP (last 3 results)  Recent Labs  04/20/16 1205 05/10/16 2215  BNP 16.0 16.0   Basic Metabolic Panel:  Recent Labs Lab 05/11/16 0524 05/12/16 0607 05/13/16 0559 05/14/16 0551 05/15/16 0601  NA 137 135 133* 136 139  K 3.5 3.1* 3.1* 3.1* 3.3*  CL 107 105 101 104 107  CO2 23 23 23 23 24   GLUCOSE 139* 143* 123* 113* 116*  BUN 7 6 8 8 8   CREATININE 0.72 0.81 0.81 1.02 0.98  CALCIUM 8.3* 8.7* 8.7* 8.8* 8.9   Liver Function Tests:  Recent Labs Lab 05/10/16 2215 05/13/16 0559 05/14/16 0551  AST 33 27 43*  ALT 29 22 35  ALKPHOS 56 51  60  BILITOT 0.5 0.6 0.6  PROT 7.4 6.4* 6.9  ALBUMIN 4.1 3.4* 3.5   No results for input(s): LIPASE, AMYLASE in the last 168 hours. No results for input(s): AMMONIA in the last 168 hours. CBC:  Recent Labs Lab 05/10/16 2215 05/11/16 0524 05/12/16 0607 05/13/16 0559 05/14/16 0551  WBC 9.5 7.5 7.8 6.3 7.3  NEUTROABS 7.7  --   --   --   --   HGB 13.6 12.4* 13.0 13.3 14.5  HCT 39.4 36.7* 37.6* 38.0* 41.2  MCV 82.9 83.6 82.3 81.4 82.2  PLT 193 176 168 165 182   Cardiac Enzymes:  Recent Labs Lab 05/10/16 2215  TROPONINI <0.03   BNP: Invalid input(s): POCBNP CBG: No results for input(s): GLUCAP in the last 168 hours. D-Dimer No results for input(s): DDIMER in the last 72 hours. Hgb A1c No results for input(s): HGBA1C in the last 72 hours. Lipid Profile No results for input(s): CHOL, HDL, LDLCALC, TRIG, CHOLHDL, LDLDIRECT in the last 72 hours. Thyroid function studies No results for input(s): TSH, T4TOTAL, T3FREE, THYROIDAB in the last 72 hours.  Invalid input(s): FREET3 Anemia work up No results for input(s): VITAMINB12, FOLATE, FERRITIN, TIBC, IRON, RETICCTPCT in the last 72 hours. Urinalysis    Component Value Date/Time   COLORURINE YELLOW 05/10/2016 2310   APPEARANCEUR CLEAR 05/10/2016 2310   LABSPEC 1.010 05/10/2016 2310   PHURINE 6.0 05/10/2016 2310   GLUCOSEU NEGATIVE 05/10/2016 2310   HGBUR NEGATIVE 05/10/2016 2310   BILIRUBINUR NEGATIVE 05/10/2016 2310   KETONESUR NEGATIVE 05/10/2016 2310   PROTEINUR NEGATIVE 05/10/2016 2310   NITRITE NEGATIVE 05/10/2016 2310   LEUKOCYTESUR NEGATIVE 05/10/2016 2310   Sepsis Labs Invalid input(s): PROCALCITONIN,  WBC,  LACTICIDVEN Microbiology Recent Results (from the past 240 hour(s))  Culture, blood (Routine x 2)     Status: None   Collection Time: 05/10/16 10:15 PM  Result Value Ref Range Status   Specimen Description BLOOD  RIGHT ANTECUBITAL DRAWN BY RN  Final   Special Requests BOTTLES DRAWN AEROBIC AND  ANAEROBIC [facility]6CC EACH  Final   Culture NO GROWTH 5 DAYS  Final   Report Status 05/15/2016 FINAL  Final  Culture, blood (Routine x 2)     Status: None   Collection Time: 05/10/16 10:26 PM  Result Value Ref Range Status   Specimen Description BLOOD LEFT ANTECUBITAL DRAWN BY RN  Final   Special Requests BOTTLES DRAWN AEROBIC AND ANAEROBIC [facility]6CC EACH  Final   Culture NO GROWTH 5 DAYS  Final   Report Status 05/15/2016 FINAL  Final  Urine culture     Status: Abnormal   Collection Time: 05/10/16 11:10 PM  Result Value Ref Range Status   Specimen Description URINE, RANDOM  Final   Special Requests NONE  Final   Culture (A)  Final    <10,000 COLONIES/mL INSIGNIFICANT GROWTH [facility]    Report Status 05/13/2016 FINAL  Final  C difficile quick scan w PCR reflex     Status: None   Collection Time: 05/13/16  2:38 PM  Result Value Ref Range Status   C Diff antigen NEGATIVE NEGATIVE Final   C Diff toxin NEGATIVE NEGATIVE Final   C Diff interpretation No C. difficile detected.  Final     Time coordinating discharge: Less than 30 minutes  SIGNED:   Bennett ScrapeAlex Ukleja, MD  [facility] 05/15/2016, 2:14 PM Pager [phone] If 7PM-7AM, please contact night-coverage [email or web address] Password [email or web address]

## 2016-05-15 NOTE — Care Management Note (Signed)
Case Management Note  Patient Details  Name: Ian Roberson MRN: [id number] Date of Birth: Apr 22, 1951   Expected Discharge Date:      05/15/2016            Expected Discharge Plan:  Home/Self Care  In-House Referral:  NA  Discharge planning Services  CM Consult  Post Acute Care Choice:  NA Choice offered to:  NA  DME Arranged:    DME Agency:     HH Arranged:    HH Agency:     Status of Service:  Completed, signed off  If discussed at [employer] Length of Stay Meetings, dates discussed:    Additional Comments: Pt discharging home today with self care. Wife at bedside. No CM needs.  Malcolm Metrohildress, Jakaleb Payer Demske, RN 05/15/2016, 10:32 AM

## 2016-05-15 NOTE — Progress Notes (Signed)
Late entry - 1535 reviewed pt's d/c instructions and medications.  VSS stable.  Pt has no questions at this time and chooses to walk himself out with family member.

## 2016-05-15 NOTE — Care Management Important Message (Signed)
Important Message  Patient Details  Name: Ian Roberson MRN: [id number] Date of Birth: 1951/07/16   Medicare Important Message Given:  Yes    Malcolm MetroChildress, Macai Sisneros Demske, RN 05/15/2016, 10:31 AM

## 2016-08-12 ENCOUNTER — Emergency Department (HOSPITAL_COMMUNITY)
Admission: EM | Admit: 2016-08-12 | Discharge: 2016-08-12 | Disposition: A | Discharge status: Home / Self Care | Payer: Medicare Other | Attending: Dermatology | Admitting: Dermatology

## 2016-08-12 ENCOUNTER — Encounter (HOSPITAL_COMMUNITY): Payer: Self-pay | Admitting: Emergency Medicine

## 2016-08-12 DIAGNOSIS — Z79899 Other long term (current) drug therapy: Secondary | ICD-10-CM | POA: Insufficient documentation

## 2016-08-12 DIAGNOSIS — Z5321 Procedure and treatment not carried out due to patient leaving prior to being seen by health care provider: Secondary | ICD-10-CM | POA: Diagnosis not present

## 2016-08-12 DIAGNOSIS — I10 Essential (primary) hypertension: Secondary | ICD-10-CM | POA: Insufficient documentation

## 2016-08-12 DIAGNOSIS — R51 Headache: Secondary | ICD-10-CM | POA: Insufficient documentation

## 2016-08-12 DIAGNOSIS — Z87891 Personal history of nicotine dependence: Secondary | ICD-10-CM | POA: Insufficient documentation

## 2016-08-12 DIAGNOSIS — R0602 Shortness of breath: Secondary | ICD-10-CM | POA: Diagnosis not present

## 2016-08-12 NOTE — ED Triage Notes (Signed)
Waken by headache at 3 am and SOB.  Used inhaler and took BP at 3:30 am.  Currently 177/88.

## 2016-10-01 ENCOUNTER — Emergency Department (HOSPITAL_COMMUNITY): Payer: Medicare Other

## 2016-10-01 ENCOUNTER — Emergency Department (HOSPITAL_COMMUNITY)
Admission: EM | Admit: 2016-10-01 | Discharge: 2016-10-02 | Disposition: A | Discharge status: Home / Self Care | Payer: Medicare Other | Attending: Emergency Medicine | Admitting: Emergency Medicine

## 2016-10-01 ENCOUNTER — Encounter (HOSPITAL_COMMUNITY): Payer: Self-pay | Admitting: Emergency Medicine

## 2016-10-01 DIAGNOSIS — R0789 Other chest pain: Secondary | ICD-10-CM | POA: Insufficient documentation

## 2016-10-01 DIAGNOSIS — Z79899 Other long term (current) drug therapy: Secondary | ICD-10-CM | POA: Insufficient documentation

## 2016-10-01 DIAGNOSIS — R000 Tachycardia, unspecified: Secondary | ICD-10-CM | POA: Insufficient documentation

## 2016-10-01 DIAGNOSIS — I10 Essential (primary) hypertension: Secondary | ICD-10-CM | POA: Diagnosis not present

## 2016-10-01 DIAGNOSIS — R079 Chest pain, unspecified: Secondary | ICD-10-CM | POA: Diagnosis present

## 2016-10-01 DIAGNOSIS — Z87891 Personal history of nicotine dependence: Secondary | ICD-10-CM | POA: Insufficient documentation

## 2016-10-01 LAB — BASIC METABOLIC PANEL
ANION GAP: 10 (ref 5–15)
BUN: 14 mg/dL (ref 6–20)
CHLORIDE: 101 mmol/L (ref 101–111)
CO2: 23 mmol/L (ref 22–32)
Calcium: 9.5 mg/dL (ref 8.9–10.3)
Creatinine, Ser: 0.91 mg/dL (ref 0.61–1.24)
GFR calc non Af Amer: >60 mL/min (ref >60)
GLUCOSE: 117 mg/dL — AB (ref 65–99)
Potassium: 3.4 mmol/L — ABNORMAL LOW (ref 3.5–5.1)
Sodium: 134 mmol/L — ABNORMAL LOW (ref 135–145)

## 2016-10-01 LAB — I-STAT TROPONIN, ED: TROPONIN I, POC: 0 ng/mL (ref 0.00–0.08)

## 2016-10-01 LAB — CBC
HEMATOCRIT: 42.4 % (ref 39.0–52.0)
HEMOGLOBIN: 14.7 g/dL (ref 13.0–17.0)
MCH: 29 pg (ref 26.0–34.0)
MCHC: 34.7 g/dL (ref 30.0–36.0)
MCV: 83.6 fL (ref 78.0–100.0)
Platelets: 217 10*3/uL (ref 150–400)
RBC: 5.07 MIL/uL (ref 4.22–5.81)
RDW: 13.4 % (ref 11.5–15.5)
WBC: 9.4 10*3/uL (ref 4.0–10.5)
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
## 2016-10-01 NOTE — ED Triage Notes (Signed)
Pt states after eating dinner his BP was 170/100 and his pulse was 111.

## 2016-10-02 LAB — I-STAT TROPONIN, ED: TROPONIN I, POC: 0 ng/mL (ref 0.00–0.08)

## 2016-10-02 MED ORDER — MORPHINE SULFATE (PF) 4 MG/ML IV SOLN
4.0000 mg | Freq: Once | INTRAVENOUS | Status: AC
  Administered 2016-10-02: 4 mg via INTRAVENOUS
  Filled 2016-10-02: qty 1

## 2016-10-02 MED ORDER — NITROGLYCERIN 0.4 MG SL SUBL
0.4000 mg | SUBLINGUAL_TABLET | Freq: Once | SUBLINGUAL | Status: AC
  Administered 2016-10-02: 0.4 mg via SUBLINGUAL
  Filled 2016-10-02: qty 1

## 2016-10-02 MED ORDER — ASPIRIN 81 MG PO CHEW
324.0000 mg | CHEWABLE_TABLET | Freq: Once | ORAL | Status: AC
  Administered 2016-10-02: 324 mg via ORAL
  Filled 2016-10-02: qty 4

## 2016-10-02 NOTE — Discharge Instructions (Signed)
We saw you in the ER for the chest pains. All of our cardiac workup is normal, including labs, EKG and chest X-RAY are normal. We are not sure what is causing your discomfort, but we feel comfortable sending you home at this time. The workup in the ER is not complete, and you should follow up with your primary care doctor for further evaluation.  We still think you need formal evaluation by Cardiologist, as you have cardiac risk factors.  Please return to the ER if you have worsening chest pain, shortness of breath, pain radiating to your jaw, shoulder, or back, sweats or fainting. Otherwise see the Cardiologist or your primary care doctor as requested.

## 2017-03-13 IMAGING — CT CT ANGIO CHEST
2 of 6 series · 19 of 46 positions shown · IV contrast (ISOVUE)
Comparison: Chest radiograph performed 05/10/2016, and CTA of the
chest performed 04/20/2016

CLINICAL DATA: Acute onset of shortness of breath, worsened with
exertion. Fever. Initial encounter.

EXAM:
CT ANGIOGRAPHY CHEST WITH CONTRAST
TECHNIQUE: Multidetector CT imaging of the chest was performed using the
standard protocol during bolus administration of intravenous
contrast. Multiplanar CT image reconstructions and MIPs were
obtained to evaluate the vascular anatomy.
CONTRAST:  100 mL of Isovue 370 IV contrast

[Series 5: pe thins 1.0 · axial · 0.77mm/px · z∈[-317,-38]mm · 16 of 311 slices shown]
[im 16/311  lung]
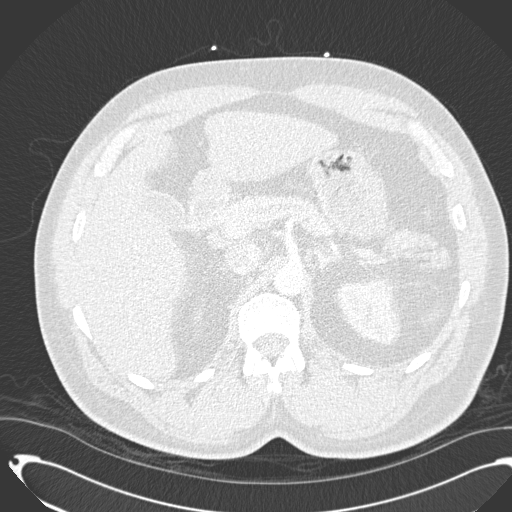
[im 32/311  soft-tissue]
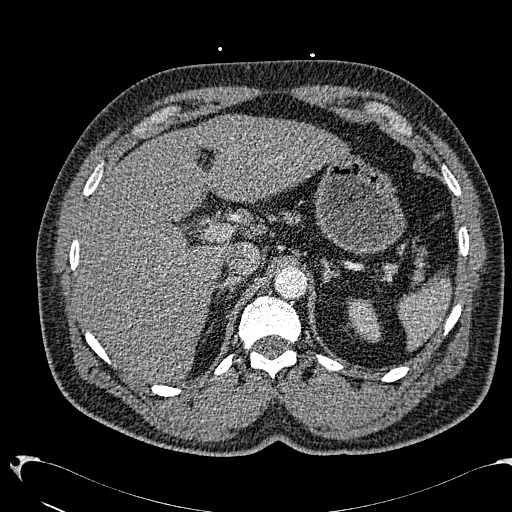
[im 47/311  lung]
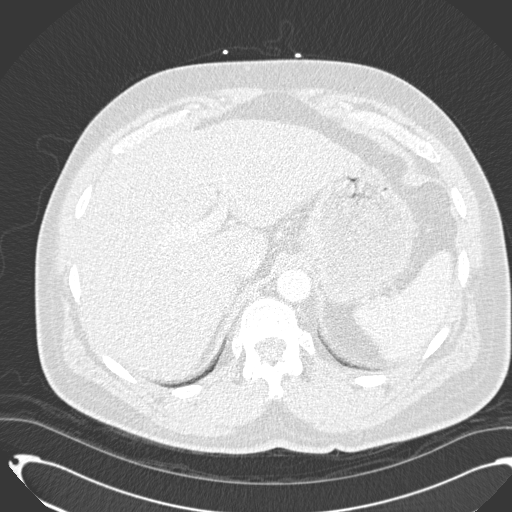
[im 78/311  soft-tissue]
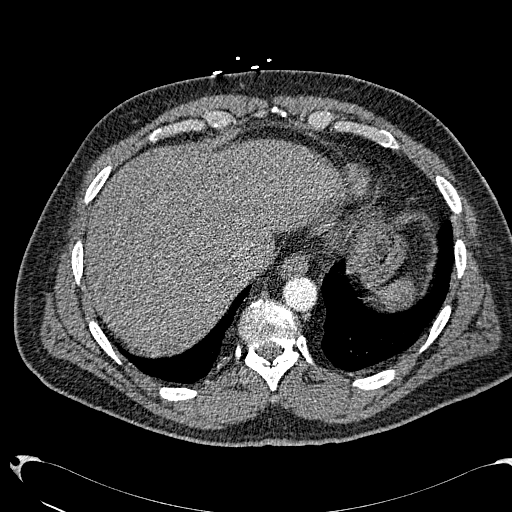
[im 94/311  lung]
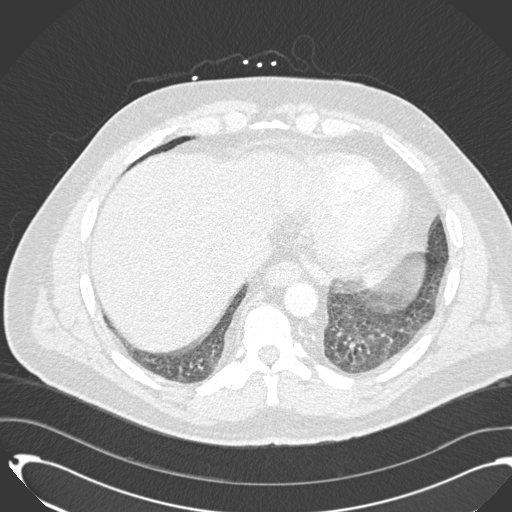
[im 109/311  soft-tissue]
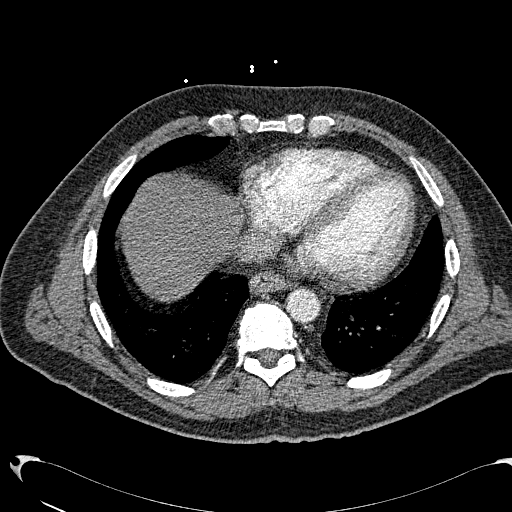
[im 125/311  lung]
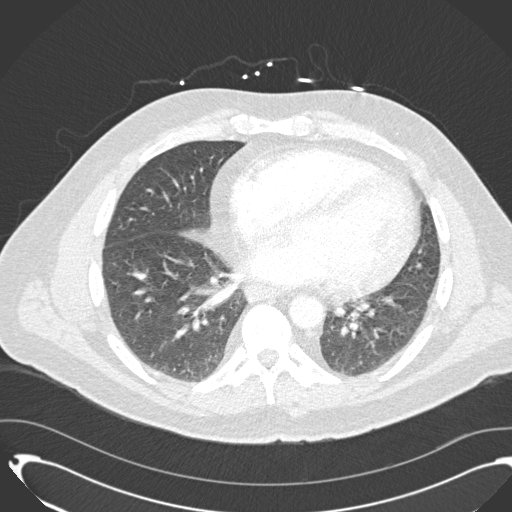
[im 140/311  soft-tissue]
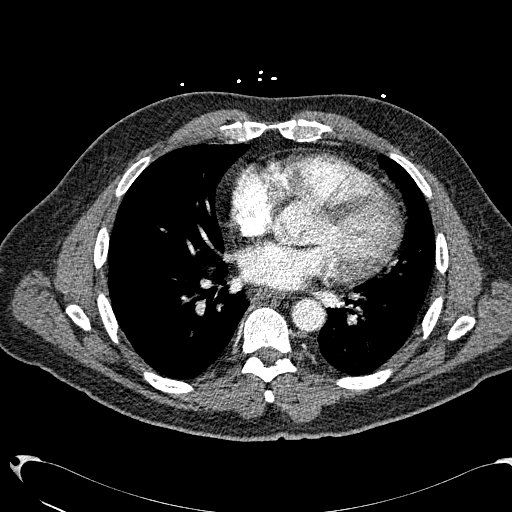
[im 171/311  lung]
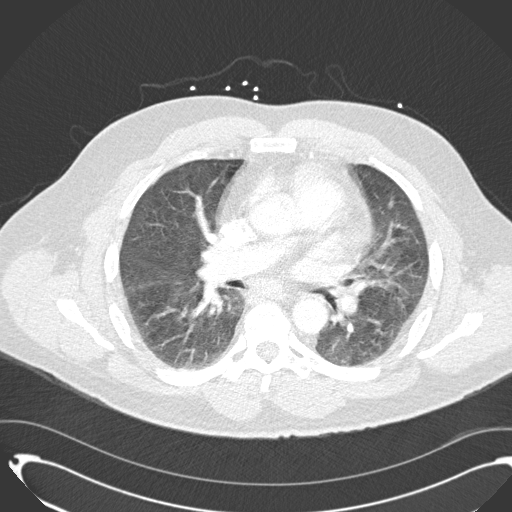
[im 187/311  soft-tissue]
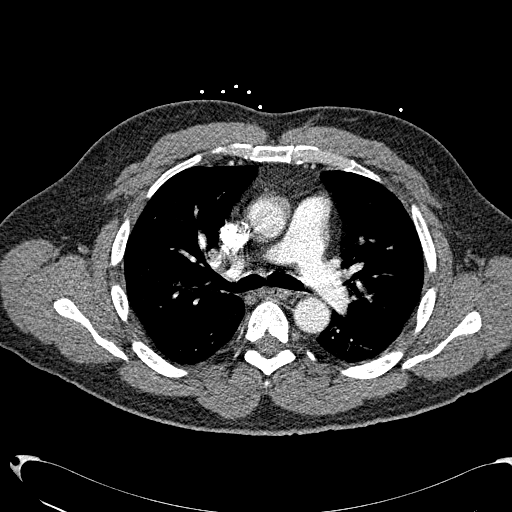
[im 202/311  lung]
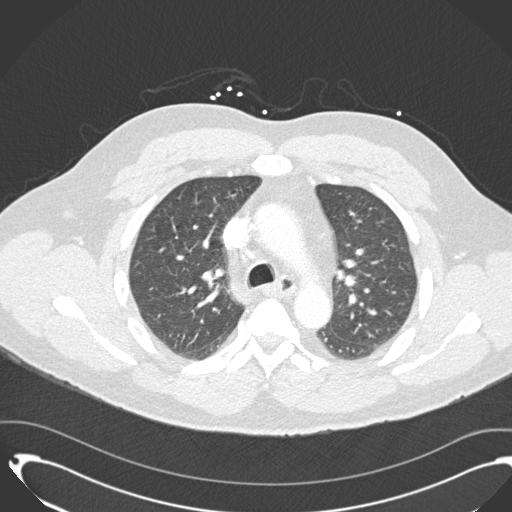
[im 218/311  soft-tissue]
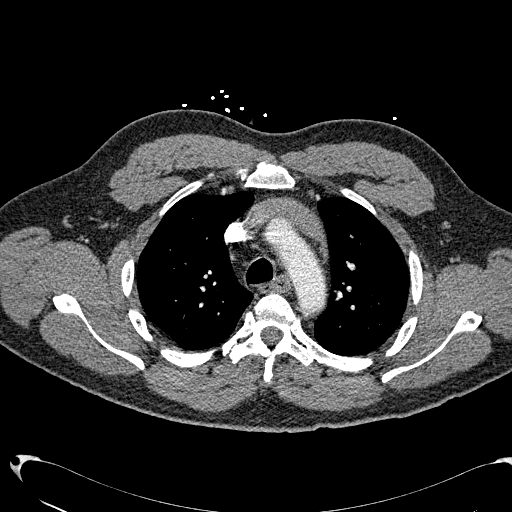
[im 233/311  lung]
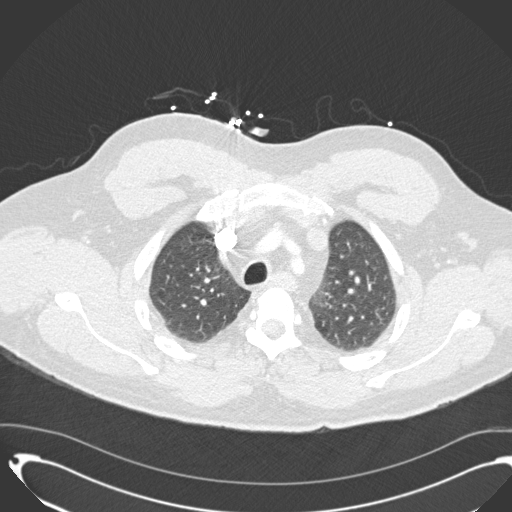
[im 264/311  soft-tissue]
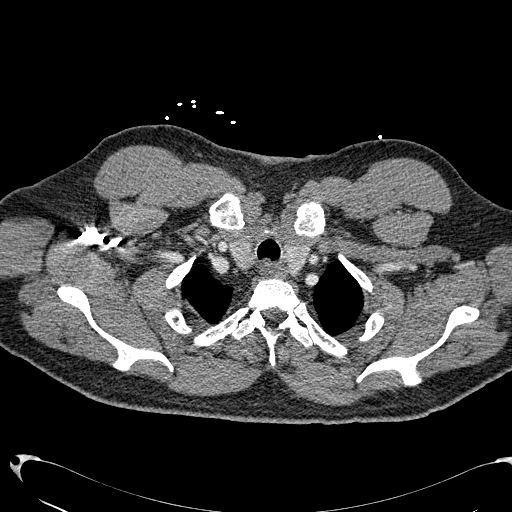
[im 280/311  lung]
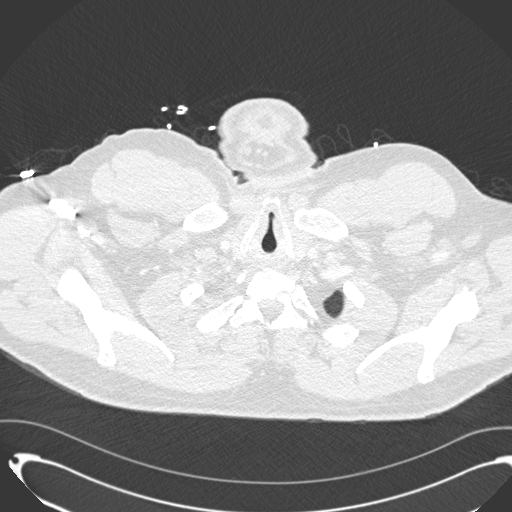
[im 295/311  soft-tissue]
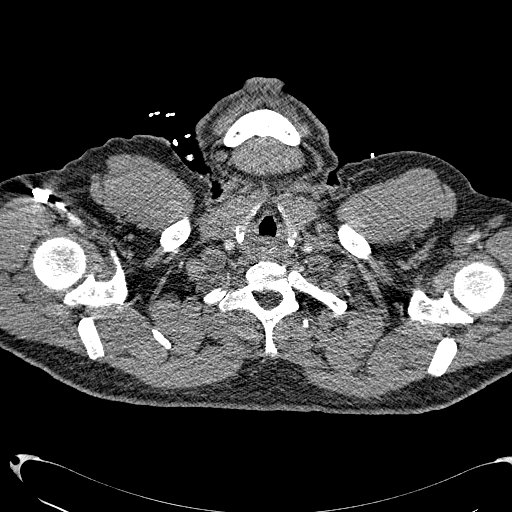

[Series 7: cor mpr 2.0 · coronal · 0.63mm/px · 3 of 140 slices shown]
[im 35/140  soft-tissue]
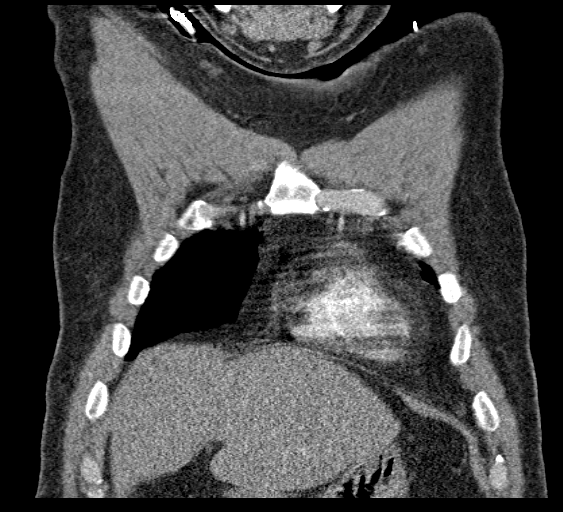
[im 70/140  soft-tissue]
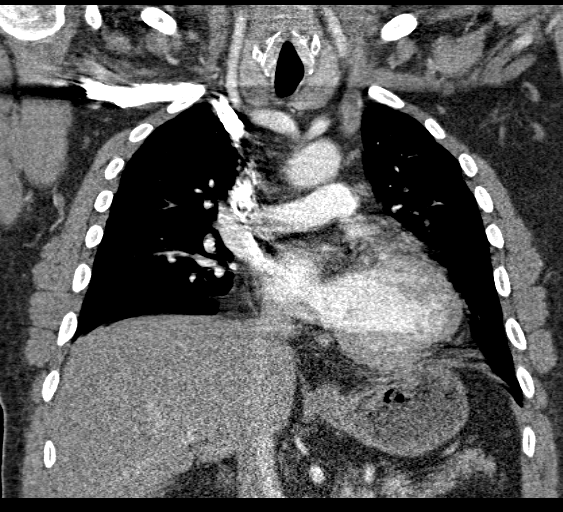
[im 105/140  soft-tissue]
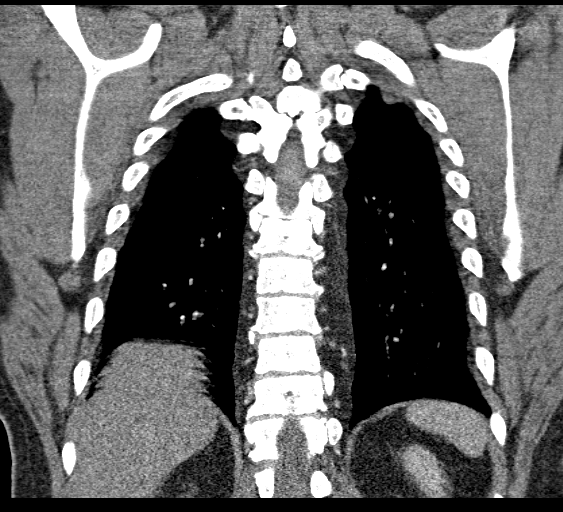

[19 of 46 positions shown; findings below may reference images not displayed]

FINDINGS: There is no evidence of pulmonary embolus.

The lungs are clear bilaterally. There is no evidence of significant
focal consolidation, pleural effusion or pneumothorax. No masses are
identified; no abnormal focal contrast enhancement is seen.

The mediastinum is unremarkable in appearance. No mediastinal
lymphadenopathy seen. No pericardial effusion is identified. The
great vessels are grossly unremarkable in appearance. No axillary
lymphadenopathy is seen. The visualized portions of the thyroid
gland are unremarkable in appearance.

The visualized portions of the liver and spleen are unremarkable.
The visualized portions of the pancreas, gallbladder, stomach,
adrenal glands and kidneys are within normal limits.

No acute osseous abnormalities are seen. Mild degenerative change is
noted at the lower cervical spine.

Review of the MIP images confirms the above findings.
IMPRESSION: No evidence of pulmonary embolus.  Lungs clear bilaterally.

## 2019-02-08 ENCOUNTER — Other Ambulatory Visit: Payer: Medicare Other

## 2019-02-08 ENCOUNTER — Other Ambulatory Visit: Payer: Self-pay

## 2019-02-08 DIAGNOSIS — Z20822 Contact with and (suspected) exposure to covid-19: Secondary | ICD-10-CM

## 2019-02-10 LAB — SPECIMEN STATUS REPORT

## 2019-02-10 LAB — NOVEL CORONAVIRUS, NAA: SARS-CoV-2, NAA: NOT DETECTED

## 2020-05-28 ENCOUNTER — Other Ambulatory Visit: Payer: Self-pay

## 2020-05-28 ENCOUNTER — Ambulatory Visit
Admission: EM | Admit: 2020-05-28 | Discharge: 2020-05-28 | Disposition: A | Discharge status: Home / Self Care | Payer: Medicare Other | Attending: Family Medicine | Admitting: Family Medicine

## 2020-05-28 ENCOUNTER — Encounter: Payer: Self-pay | Admitting: Emergency Medicine

## 2020-05-28 DIAGNOSIS — L259 Unspecified contact dermatitis, unspecified cause: Secondary | ICD-10-CM

## 2020-05-28 DIAGNOSIS — R21 Rash and other nonspecific skin eruption: Secondary | ICD-10-CM

## 2020-05-28 MED ORDER — PREDNISONE 10 MG (21) PO TBPK: ORAL_TABLET | Freq: Every day | ORAL | 0 refills | Status: AC

## 2020-05-28 MED ORDER — TRIAMCINOLONE ACETONIDE 0.1 % EX CREA: 1.0000 "application " | TOPICAL_CREAM | Freq: Two times a day (BID) | CUTANEOUS | 0 refills | Status: DC

## 2020-05-28 NOTE — ED Provider Notes (Signed)
[facility]   [id number] 05/28/20 Arrival Time: 1247  CC: RASH  SUBJECTIVE:  Ian Roberson is a 69 y.o. male who presents with a skin complaint that began about 2 weeks ago.  Reports that he has a rash to the inner aspect of the right elbow as well as the outer aspect of the left elbow.  States that he thinks that this is shingles.  Denies being outside or having new exposures. Denies precipitating event or trauma.  Denies changes in soaps, detergents, close contacts with similar rash, known trigger or environmental trigger, allergy. Denies medications change or starting a new medication recently.  Has been applying alcohol to the area to help with itch.  There are no aggravating or alleviating factors. Denies similar symptoms in the past.  Denies fever, chills, nausea, vomiting, erythema, swelling, discharge, oral lesions, SOB, chest pain, abdominal pain, changes in bowel or bladder function.    ROS: As per HPI.  All other pertinent ROS negative.     Past Medical History:  Diagnosis Date  . Gout   . Hyperlipidemia   . Hypertension    Past Surgical History:  Procedure Laterality Date  . FOOT SURGERY    . KNEE SURGERY    . NECK SURGERY     Allergies  Allergen Reactions  . Penicillins Other (See Comments)    Convulsions Has patient had a PCN reaction causing immediate rash, facial/tongue/throat swelling, SOB or lightheadedness with hypotension: No Has patient had a PCN reaction causing severe rash involving mucus membranes or skin necrosis: No Has patient had a PCN reaction that required hospitalization No Has patient had a PCN reaction occurring within the last 10 years: No If all of the above answers are "NO", then may proceed with Cephalosporin use.    No current facility-administered medications on file prior to encounter.   Current Outpatient Medications on File Prior to Encounter  Medication Sig Dispense Refill  . allopurinol (ZYLOPRIM) 100 MG tablet Take 100  mg by mouth daily.    Marland Kitchen amLODipine (NORVASC) 10 MG tablet Take 10 mg by mouth daily.    . Cholecalciferol (VITAMIN D) 2000 units CAPS Take 1 capsule by mouth daily.    Marland Kitchen HYDROcodone-acetaminophen (NORCO) 10-325 MG tablet Take 1 tablet by mouth every 6 (six) hours as needed for moderate pain.     Social History   Socioeconomic History  . Marital status: Married    Spouse name: Not on file  . Number of children: Not on file  . Years of education: Not on file  . Highest education level: Not on file  Occupational History  . Not on file  Tobacco Use  . Smoking status: Former Smoker    Types: Cigarettes  . Smokeless tobacco: Never Used  Substance and Sexual Activity  . Alcohol use: Yes    Comment: weekends  . Drug use: No  . Sexual activity: Never  Other Topics Concern  . Not on file  Social History Narrative  . Not on file   Social Determinants of Health   Financial Resource Strain:   . Difficulty of Paying Living Expenses: Not on file  Food Insecurity:   . Worried About Programme researcher, broadcasting/film/video in the Last Year: Not on file  . Ran Out of Food in the Last Year: Not on file  Transportation Needs:   . Lack of Transportation (Medical): Not on file  . Lack of Transportation (Non-Medical): Not on file  Physical Activity:   . Days  of Exercise per Week: Not on file  . Minutes of Exercise per Session: Not on file  Stress:   . Feeling of Stress : Not on file  Social Connections:   . Frequency of Communication with Friends and Family: Not on file  . Frequency of Social Gatherings with Friends and Family: Not on file  . Attends Religious Services: Not on file  . Active Member of Clubs or Organizations: Not on file  . Attends Banker Meetings: Not on file  . Marital Status: Not on file  Intimate Partner Violence:   . Fear of Current or Ex-Partner: Not on file  . Emotionally Abused: Not on file  . Physically Abused: Not on file  . Sexually Abused: Not on file   Family  History  Problem Relation Age of Onset  . Heart attack Father   . Stroke Mother     OBJECTIVE: Vitals:   05/28/20 1351  BP: (!) 155/94  Pulse: 90  Resp: 16  Temp: 98.2 F (36.8 C)  SpO2: 97%    General appearance: alert; no distress Head: NCAT Lungs: clear to auscultation bilaterally Heart: regular rate and rhythm.  Radial pulse 2+ bilaterally Extremities: no edema Skin: warm and dry; area of healing vesicular rash to inner aspect of right elbow, fine papular rash to outer aspect of left elbow, also has a small patch of rash to right chest, excoriation marks noted on all locations Psychological: alert and cooperative; normal mood and affect  ASSESSMENT & PLAN:  1. Rash and nonspecific skin eruption   2. Contact dermatitis, unspecified contact dermatitis type, unspecified trigger     Meds ordered this encounter  Medications  . predniSONE (STERAPRED UNI-PAK 21 TAB) 10 MG (21) TBPK tablet    Sig: Take by mouth daily for 6 days. Take 6 tablets on day 1, 5 tablets on day 2, 4 tablets on day 3, 3 tablets on day 4, 2 tablets on day 5, 1 tablet on day 6    Dispense:  21 tablet    Refill:  0    Order Specific Question:   Supervising Provider    Answer:   Merrilee Jansky [id number]  . triamcinolone cream (KENALOG) 0.1 %    Sig: Apply 1 application topically 2 (two) times daily.    Dispense:  30 g    Refill:  0    Order Specific Question:   Supervising Provider    Answer:   Merrilee Jansky [[id number]]    Contact dermatitis versus healing shingles to right inner aspect of elbow Prescribed prednisone taper Prescribed triamcinolone cream take as prescribed and to completion Avoid hot showers/ baths Moisturize skin daily  Follow up with PCP if symptoms persists Return or go to the ER if you have any new or worsening symptoms such as fever, chills, nausea, vomiting, redness, swelling, discharge, if symptoms do not improve with medications  Reviewed expectations re: course of  current medical issues. Questions answered. Outlined signs and symptoms indicating need for more acute intervention. Patient verbalized understanding. After Visit Summary given.   Moshe Cipro, NP 05/28/20 1433

## 2020-05-28 NOTE — Discharge Instructions (Addendum)
I have sent in a prednisone taper for you to take for 6 days. 6 tablets on day one, 5 tablets on day two, 4 tablets on day three, 3 tablets on day four, 2 tablets on day five, and 1 tablet on day six.  I have sent triamcinolone cream for you to use twice a day for the itching  Follow up with this office or with primary care if symptoms are persisiting

## 2020-05-28 NOTE — ED Triage Notes (Signed)
Patient states that he feelsl ike he has shingles on both arms and on abdomen.

## 2021-01-09 ENCOUNTER — Other Ambulatory Visit: Payer: Self-pay

## 2021-01-09 ENCOUNTER — Ambulatory Visit
Admission: EM | Admit: 2021-01-09 | Discharge: 2021-01-09 | Disposition: A | Discharge status: Home / Self Care | Payer: No Typology Code available for payment source | Attending: Emergency Medicine | Admitting: Emergency Medicine

## 2021-01-09 DIAGNOSIS — R21 Rash and other nonspecific skin eruption: Secondary | ICD-10-CM

## 2021-01-09 DIAGNOSIS — J329 Chronic sinusitis, unspecified: Secondary | ICD-10-CM

## 2021-01-09 MED ORDER — PREDNISONE 20 MG PO TABS: 20.0000 mg | ORAL_TABLET | Freq: Two times a day (BID) | ORAL | 0 refills | Status: AC

## 2021-01-09 MED ORDER — CETIRIZINE HCL 10 MG PO TABS: 10.0000 mg | ORAL_TABLET | Freq: Every day | ORAL | 0 refills | Status: DC

## 2021-01-09 MED ORDER — AZELASTINE HCL 0.1 % NA SOLN: 1.0000 | Freq: Two times a day (BID) | NASAL | 12 refills | Status: AC

## 2021-01-09 NOTE — ED Provider Notes (Addendum)
[facility]   [id number] 01/09/21 Arrival Time: 1250   CC: sinus burning  SUBJECTIVE: History from: patient.  Ian Roberson is a 70 y.o. male who presents with chronic nasal and sinus burning as well as drainage x 7 months.  Denies sick exposure to COVID, flu or strep.  Has tried flonase without relief.  Denies aggravating factors.   Denies previous symptoms in the past.   Denies fever, chills, fatigue, SOB, wheezing, chest pain, nausea, changes in bowel or bladder habits.    Also mentions rash to RT elbow.  Chronic as well.  Itchy.    ROS: As per HPI.  All other pertinent ROS negative.     Past Medical History:  Diagnosis Date  . Gout   . Hyperlipidemia   . Hypertension    Past Surgical History:  Procedure Laterality Date  . FOOT SURGERY    . KNEE SURGERY    . NECK SURGERY     Allergies  Allergen Reactions  . Penicillins Other (See Comments)    Convulsions Has patient had a PCN reaction causing immediate rash, facial/tongue/throat swelling, SOB or lightheadedness with hypotension: No Has patient had a PCN reaction causing severe rash involving mucus membranes or skin necrosis: No Has patient had a PCN reaction that required hospitalization No Has patient had a PCN reaction occurring within the last 10 years: No If all of the above answers are "NO", then may proceed with Cephalosporin use.    No current facility-administered medications on file prior to encounter.   Current Outpatient Medications on File Prior to Encounter  Medication Sig Dispense Refill  . allopurinol (ZYLOPRIM) 100 MG tablet Take 100 mg by mouth daily.    Marland Kitchen amLODipine (NORVASC) 10 MG tablet Take 10 mg by mouth daily.    . Cholecalciferol (VITAMIN D) 2000 units CAPS Take 1 capsule by mouth daily.    Marland Kitchen HYDROcodone-acetaminophen (NORCO) 10-325 MG tablet Take 1 tablet by mouth every 6 (six) hours as needed for moderate pain.     Social History   Socioeconomic History  . Marital status:  Married    Spouse name: Not on file  . Number of children: Not on file  . Years of education: Not on file  . Highest education level: Not on file  Occupational History  . Not on file  Tobacco Use  . Smoking status: Former Smoker    Types: Cigarettes  . Smokeless tobacco: Never Used  Substance and Sexual Activity  . Alcohol use: Yes    Comment: weekends  . Drug use: No  . Sexual activity: Never  Other Topics Concern  . Not on file  Social History Narrative  . Not on file   Social Determinants of Health   Financial Resource Strain: Not on file  Food Insecurity: Not on file  Transportation Needs: Not on file  Physical Activity: Not on file  Stress: Not on file  Social Connections: Not on file  Intimate Partner Violence: Not on file   Family History  Problem Relation Age of Onset  . Heart attack Father   . Stroke Mother     OBJECTIVE:  Vitals:   01/09/21 1351  BP: (!) 152/93  Pulse: (!) 111  Resp: 16  Temp: 98.8 F (37.1 C)  SpO2: 96%     General appearance: alert; well-appearing, nontoxic; speaking in full sentences and tolerating own secretions HEENT: NCAT; Ears: EACs clear, TMs pearly gray; Eyes: PERRL.  EOM grossly intact.Nose: nares patent without rhinorrhea, difficult  to visualize turbinates due to hair, Throat: oropharynx clear, tonsils non erythematous or enlarged, uvula midline  Neck: supple without LAD Lungs: unlabored respirations, symmetrical air entry; cough: absent; no respiratory distress; CTAB Heart: regular rate and rhythm.  Skin: warm and dry, hyperpigmentation papular/ macular rash to RT lateral elbow, NTTP, no obvious drainage or bleeding Psychological: alert and cooperative; normal mood and affect   ASSESSMENT & PLAN:  1. Chronic sinusitis, unspecified location   2. Rash     Meds ordered this encounter  Medications  . predniSONE (DELTASONE) 20 MG tablet    Sig: Take 1 tablet (20 mg total) by mouth 2 (two) times daily with a meal for 5  days.    Dispense:  10 tablet    Refill:  0    Order Specific Question:   Supervising Provider    Answer:   Eustace Moore [[id number]]  . cetirizine (ZYRTEC) 10 MG tablet    Sig: Take 1 tablet (10 mg total) by mouth daily.    Dispense:  30 tablet    Refill:  0    Order Specific Question:   Supervising Provider    Answer:   Eustace Moore [[id number]]  . azelastine (ASTELIN) 0.1 % nasal spray    Sig: Place 1 spray into both nostrils 2 (two) times daily. Use in each nostril as directed    Dispense:  30 mL    Refill:  12    Order Specific Question:   Supervising Provider    Answer:   Eustace Moore [[id number]]    Get plenty of rest and push fluids Prednisone prescribed.  Take as directed and to completion Zyrtec and azelastine nasal spray Use medications daily for symptom relief Use OTC medications like ibuprofen or tylenol as needed fever or pain Follow up with ear, nose, throat doctor for further evaluation and management Call or go to the ED if you have any new or worsening symptoms such as fever, cough, shortness of breath, chest tightness, chest pain, turning blue, changes in mental status, etc...   Reviewed expectations re: course of current medical issues. Questions answered. Outlined signs and symptoms indicating need for more acute intervention. Patient verbalized understanding. After Visit Summary given.         Rennis Harding, PA-C 01/09/21 1430    Alvino Chapel [place], [place] 01/09/21 1432

## 2021-01-09 NOTE — ED Triage Notes (Signed)
Provider triage  

## 2021-01-09 NOTE — Discharge Instructions (Signed)
Get plenty of rest and push fluids Prednisone prescribed.  Take as directed and to completion Zyrtec and azelastine nasal spray Use medications daily for symptom relief Use OTC medications like ibuprofen or tylenol as needed fever or pain Follow up with ear, nose, throat doctor for further evaluation and management Call or go to the ED if you have any new or worsening symptoms such as fever, cough, shortness of breath, chest tightness, chest pain, turning blue, changes in mental status, etc..Marland Kitchen

## 2021-03-20 ENCOUNTER — Encounter: Payer: Self-pay | Admitting: Emergency Medicine

## 2021-03-20 ENCOUNTER — Ambulatory Visit
Admission: EM | Admit: 2021-03-20 | Discharge: 2021-03-20 | Disposition: A | Discharge status: Home / Self Care | Payer: No Typology Code available for payment source | Attending: Emergency Medicine | Admitting: Emergency Medicine

## 2021-03-20 ENCOUNTER — Other Ambulatory Visit: Payer: Self-pay

## 2021-03-20 DIAGNOSIS — S80862A Insect bite (nonvenomous), left lower leg, initial encounter: Secondary | ICD-10-CM

## 2021-03-20 DIAGNOSIS — W57XXXA Bitten or stung by nonvenomous insect and other nonvenomous arthropods, initial encounter: Secondary | ICD-10-CM

## 2021-03-20 DIAGNOSIS — S80861A Insect bite (nonvenomous), right lower leg, initial encounter: Secondary | ICD-10-CM

## 2021-03-20 MED ORDER — DOXYCYCLINE HYCLATE 100 MG PO TABS
200.0000 mg | ORAL_TABLET | Freq: Once | ORAL | Status: AC
  Administered 2021-03-20: 200 mg via ORAL

## 2021-03-20 NOTE — ED Provider Notes (Signed)
[facility]   [id number] 03/20/21 Arrival Time: 1213  CC: Tick bite  SUBJECTIVE:  Ian Roberson is a 70 y.o. male complains of two tick attached to back of bilateral legs last week.  Wife removed at home  Denies a precipitating event.  Localizes the bite to back of legs.  Complains of fatigued and myalgias.  Denies fever, chills, nausea, vomiting, headache, dizziness, weakness, rash, or abdominal pain.    ROS: As per HPI.  All other pertinent ROS negative.     Past Medical History:  Diagnosis Date   Gout    Hyperlipidemia    Hypertension    Past Surgical History:  Procedure Laterality Date   FOOT SURGERY     KNEE SURGERY     NECK SURGERY     Allergies  Allergen Reactions   Penicillins Other (See Comments)    Convulsions Has patient had a PCN reaction causing immediate rash, facial/tongue/throat swelling, SOB or lightheadedness with hypotension: No Has patient had a PCN reaction causing severe rash involving mucus membranes or skin necrosis: No Has patient had a PCN reaction that required hospitalization No Has patient had a PCN reaction occurring within the last 10 years: No If all of the above answers are "NO", then may proceed with Cephalosporin use.    No current facility-administered medications on file prior to encounter.   Current Outpatient Medications on File Prior to Encounter  Medication Sig Dispense Refill   allopurinol (ZYLOPRIM) 100 MG tablet Take 100 mg by mouth daily.     amLODipine (NORVASC) 10 MG tablet Take 10 mg by mouth daily.     azelastine (ASTELIN) 0.1 % nasal spray Place 1 spray into both nostrils 2 (two) times daily. Use in each nostril as directed 30 mL 12   cetirizine (ZYRTEC) 10 MG tablet Take 1 tablet (10 mg total) by mouth daily. 30 tablet 0   Cholecalciferol (VITAMIN D) 2000 units CAPS Take 1 capsule by mouth daily.     HYDROcodone-acetaminophen (NORCO) 10-325 MG tablet Take 1 tablet by mouth every 6 (six) hours as needed for  moderate pain.     Social History   Socioeconomic History   Marital status: Married    Spouse name: Not on file   Number of children: Not on file   Years of education: Not on file   Highest education level: Not on file  Occupational History   Not on file  Tobacco Use   Smoking status: Former    Types: Cigarettes   Smokeless tobacco: Never  Substance and Sexual Activity   Alcohol use: Yes    Comment: weekends   Drug use: No   Sexual activity: Never  Other Topics Concern   Not on file  Social History Narrative   Not on file   Social Determinants of Health   Financial Resource Strain: Not on file  Food Insecurity: Not on file  Transportation Needs: Not on file  Physical Activity: Not on file  Stress: Not on file  Social Connections: Not on file  Intimate Partner Violence: Not on file   Family History  Problem Relation Age of Onset   Heart attack Father    Stroke Mother     OBJECTIVE: Vitals:   03/20/21 1302  BP: (!) 156/84  Pulse: (!) 114  Resp: 16  Temp: 98.6 F (37 C)  TempSrc: Oral  SpO2: 96%    General appearance: alert; no distress Head: NCAT Lungs: normal respiratory effort Extremities: no edema Skin: warm  and dry; two papules with dry skin/ scabbing, one to posterior medial LT knee and another to same location of RT knee, NTTP, no obvious drainage, bleeding, or erythema Psychological: alert and cooperative; normal mood and affect  ASSESSMENT & PLAN:  1. Tick bite of left lower leg, initial encounter   2. Tick bite of right lower leg, initial encounter     Meds ordered this encounter  Medications   doxycycline (VIBRA-TABS) tablet 200 mg   Given single prophylactic dose of doxycycline 200 mg To prevent tick bites, wear long sleeves, long pants, and light colors. Use insect repellent. Follow the instructions on the bottle. If the tick is biting, do not try to remove it with heat, alcohol, petroleum jelly, or fingernail polish. Use tweezers,  curved forceps, or a tick-removal tool to grasp the tick. Gently pull up until the tick lets go. Do not twist or jerk the tick. Do not squeeze or crush the tick. Return here or go to ER if you have any new or worsening symptoms (rash, nausea, vomiting, fever, chills, headache, fatigue)   Reviewed expectations re: course of current medical issues. Questions answered. Outlined signs and symptoms indicating need for more acute intervention. Patient verbalized understanding. After Visit Summary given.    Rennis Harding, PA-C 03/20/21 1333

## 2021-03-20 NOTE — Discharge Instructions (Addendum)
Given single prophylactic dose of doxycycline 200 mg To prevent tick bites, wear long sleeves, long pants, and light colors. Use insect repellent. Follow the instructions on the bottle. If the tick is biting, do not try to remove it with heat, alcohol, petroleum jelly, or fingernail polish. Use tweezers, curved forceps, or a tick-removal tool to grasp the tick. Gently pull up until the tick lets go. Do not twist or jerk the tick. Do not squeeze or crush the tick. Return here or go to ER if you have any new or worsening symptoms (rash, nausea, vomiting, fever, chills, headache, fatigue) 

## 2021-03-20 NOTE — ED Triage Notes (Signed)
Tick bite on back of left leg on Tuesday.  States he found another spot on his other leg.  States he feels sluggish with no energy.

## 2021-07-09 ENCOUNTER — Other Ambulatory Visit: Payer: Self-pay

## 2021-07-09 ENCOUNTER — Encounter: Payer: Self-pay | Admitting: Emergency Medicine

## 2021-07-09 ENCOUNTER — Ambulatory Visit (INDEPENDENT_AMBULATORY_CARE_PROVIDER_SITE_OTHER): Payer: No Typology Code available for payment source

## 2021-07-09 ENCOUNTER — Ambulatory Visit
Admission: EM | Admit: 2021-07-09 | Discharge: 2021-07-09 | Disposition: A | Discharge status: Home / Self Care | Payer: No Typology Code available for payment source | Attending: Urgent Care | Admitting: Urgent Care

## 2021-07-09 DIAGNOSIS — M25511 Pain in right shoulder: Secondary | ICD-10-CM | POA: Diagnosis not present

## 2021-07-09 DIAGNOSIS — M25512 Pain in left shoulder: Secondary | ICD-10-CM

## 2021-07-09 DIAGNOSIS — M542 Cervicalgia: Secondary | ICD-10-CM

## 2021-07-09 DIAGNOSIS — M5030 Other cervical disc degeneration, unspecified cervical region: Secondary | ICD-10-CM | POA: Diagnosis not present

## 2021-07-09 DIAGNOSIS — M4802 Spinal stenosis, cervical region: Secondary | ICD-10-CM

## 2021-07-09 DIAGNOSIS — R0602 Shortness of breath: Secondary | ICD-10-CM

## 2021-07-09 DIAGNOSIS — Z9889 Other specified postprocedural states: Secondary | ICD-10-CM

## 2021-07-09 MED ORDER — PREDNISONE 20 MG PO TABS: ORAL_TABLET | ORAL | 0 refills | Status: DC

## 2021-07-09 NOTE — ED Triage Notes (Signed)
Bilateral shoulder and neck pain x 2 weeks .  States joints have been hurting.  States it hurts to take a deep breath and his chest feels sore x 1 week.  States he used an albuterol inhaler and symptoms improved.

## 2021-07-09 NOTE — Discharge Instructions (Addendum)
We are going to use an oral prednisone course for your neck pain and your neck problems.  Please make sure you follow-up with a spine specialist.  In the meantime use Tylenol regularly for your aches and pains at a dose of 500 mg to 650 mg once every 6 hours.

## 2021-07-09 NOTE — ED Provider Notes (Signed)
[facility]   MRN: [id number] DOB: [birth year]  Subjective:   Ian Roberson is a 70 y.o. male presenting for 2-week history of persistent lower neck pain that goes to either shoulder, worse at night.  He had an episode of chest pain, chest tightness a week ago, has a history of asthma and uses albuterol over the weekend which helped him.  He denies any active chest pain, shortness of breath or wheezing.  His primary complaint is his neck pain and shoulders.  Has a history of blood pressure, is using his medications for this.  No history of pulmonary embolism.  He does have a history of a neck surgery to remove a cyst, this is a remote surgery.  No current facility-administered medications for this encounter.  Current Outpatient Medications:    allopurinol (ZYLOPRIM) 100 MG tablet, Take 100 mg by mouth daily., Disp: , Rfl:    amLODipine (NORVASC) 10 MG tablet, Take 10 mg by mouth daily., Disp: , Rfl:    azelastine (ASTELIN) 0.1 % nasal spray, Place 1 spray into both nostrils 2 (two) times daily. Use in each nostril as directed, Disp: 30 mL, Rfl: 12   cetirizine (ZYRTEC) 10 MG tablet, Take 1 tablet (10 mg total) by mouth daily., Disp: 30 tablet, Rfl: 0   Cholecalciferol (VITAMIN D) 2000 units CAPS, Take 1 capsule by mouth daily., Disp: , Rfl:    HYDROcodone-acetaminophen (NORCO) 10-325 MG tablet, Take 1 tablet by mouth every 6 (six) hours as needed for moderate pain., Disp: , Rfl:    Allergies  Allergen Reactions   Penicillins Other (See Comments)    Convulsions Has patient had a PCN reaction causing immediate rash, facial/tongue/throat swelling, SOB or lightheadedness with hypotension: No Has patient had a PCN reaction causing severe rash involving mucus membranes or skin necrosis: No Has patient had a PCN reaction that required hospitalization No Has patient had a PCN reaction occurring within the last 10 years: No If all of the above answers are "NO", then may proceed  with Cephalosporin use.     Past Medical History:  Diagnosis Date   Gout    Hyperlipidemia    Hypertension      Past Surgical History:  Procedure Laterality Date   FOOT SURGERY     KNEE SURGERY     NECK SURGERY      Family History  Problem Relation Age of Onset   Heart attack Father    Stroke Mother     Social History   Tobacco Use   Smoking status: Former    Types: Cigarettes   Smokeless tobacco: Never  Substance Use Topics   Alcohol use: Yes    Comment: weekends   Drug use: No    ROS   Objective:   Vitals: BP (!) 154/91 (BP Location: Right Arm)   Pulse (!) 110   Temp 98.4 F (36.9 C) (Oral)   Resp 18   SpO2 96%   Physical Exam Constitutional:      General: He is not in acute distress.    Appearance: Normal appearance. He is well-developed. He is not ill-appearing, toxic-appearing or diaphoretic.  HENT:     Head: Normocephalic and atraumatic.     Right Ear: External ear normal.     Left Ear: External ear normal.     Nose: Nose normal.     Mouth/Throat:     Mouth: Mucous membranes are moist.     Pharynx: Oropharynx is clear.  Eyes:  General: No scleral icterus.    Extraocular Movements: Extraocular movements intact.     Pupils: Pupils are equal, round, and reactive to light.  Cardiovascular:     Rate and Rhythm: Normal rate and regular rhythm.     Heart sounds: Normal heart sounds. No murmur heard.   No friction rub. No gallop.  Pulmonary:     Effort: Pulmonary effort is normal. No respiratory distress.     Breath sounds: Normal breath sounds. No stridor. No wheezing, rhonchi or rales.  Musculoskeletal:     Cervical back: Spasms and tenderness (on either side of his trapezius) present. No swelling, edema, deformity, erythema, signs of trauma, lacerations, rigidity, torticollis, bony tenderness or crepitus. Pain with movement (negative Spurling maneuver) present. Normal range of motion.  Neurological:     Mental Status: He is alert and  oriented to person, place, and time.  Psychiatric:        Mood and Affect: Mood normal.        Behavior: Behavior normal.        Thought Content: Thought content normal.    DG Cervical Spine Complete  Result Date: 07/09/2021 CLINICAL DATA:  Bilateral shoulder and neck pain for 2 weeks, no known injury EXAM: CERVICAL SPINE - COMPLETE 4+ VIEW COMPARISON:  None. FINDINGS: No fracture or static subluxation of the cervical spine. Mild-to-moderate multilevel disc space height loss and osteophytosis, worst at C4 through C7. Probable bilateral neural foraminal stenosis at C6-C7. The skull base, cervical soft tissues, and upper chest are unremarkable. IMPRESSION: 1. No fracture or static subluxation of the cervical spine. 2. Mild-to-moderate multilevel disc space height loss and osteophytosis, worst at C4 through C7. Probable bilateral neural foraminal stenosis at C6-C7. Cervical disc and neural foraminal pathology may be further evaluated by MRI if indicated by neurologically localizing signs and symptoms. Electronically Signed   By: Jearld Lesch M.D.   On: 07/09/2021 13:14     Assessment and Plan :   PDMP not reviewed this encounter.  1. Degenerative disc disease, cervical   2. Neck pain   3. Shortness of breath   4. History of neck surgery   5. Foraminal stenosis of cervical region    Patient needs a consultation with neurosurgeon, spine specialist.  In the meantime recommended an oral prednisone course.  Schedule Tylenol. Counseled patient on potential for adverse effects with medications prescribed/recommended today, ER and return-to-clinic precautions discussed, patient verbalized understanding.    Wallis Bamberg, PA-C 07/09/21 1319

## 2021-09-25 ENCOUNTER — Ambulatory Visit
Admission: EM | Admit: 2021-09-25 | Discharge: 2021-09-25 | Disposition: A | Discharge status: Home / Self Care | Payer: No Typology Code available for payment source | Attending: Family Medicine | Admitting: Family Medicine

## 2021-09-25 ENCOUNTER — Encounter: Payer: Self-pay | Admitting: Emergency Medicine

## 2021-09-25 ENCOUNTER — Other Ambulatory Visit: Payer: Self-pay

## 2021-09-25 ENCOUNTER — Ambulatory Visit (HOSPITAL_COMMUNITY)
Admit: 2021-09-25 | Discharge: 2021-09-25 | Disposition: A | Discharge status: Home / Self Care | Payer: No Typology Code available for payment source | Attending: Family Medicine | Admitting: Family Medicine

## 2021-09-25 ENCOUNTER — Telehealth: Payer: Self-pay | Admitting: Family Medicine

## 2021-09-25 DIAGNOSIS — M79604 Pain in right leg: Secondary | ICD-10-CM | POA: Insufficient documentation

## 2021-09-25 DIAGNOSIS — R600 Localized edema: Secondary | ICD-10-CM | POA: Diagnosis not present

## 2021-09-25 NOTE — Telephone Encounter (Signed)
Called patient and left voicemail to return my call to discuss DVT ultrasound results from today.    If he returns call, may give him this message   his ultrasound was negative for a DVT, suspect his leg pain is inflammatory in nature.  Continue ibuprofen, ice, elevation, compression and follow-up with primary care provider for recheck next week.

## 2021-09-25 NOTE — ED Provider Notes (Signed)
[facility]    CSN: [id number] Arrival date & time: 09/25/21  1033      History   Chief Complaint No chief complaint on file.   HPI Ian Roberson is a 71 y.o. male.   Patient presenting today with 2 to 3-week history of progressively worsening right knee pain, diffuse swelling and pain in the right lower leg down to ankle.  States ambulation makes pain worse in the lower leg.  He denies injury to the area, redness, fever, skin injury, numbness, tingling, weakness, chest pain, shortness of breath, palpitations, dizziness, history of DVT.  Has a known history of osteoarthritis of the right knee status post 2 arthroscopies in the past but states he is never had swelling in this leg before.  Has not been trying anything over-the-counter for symptoms thus far.  Of note, does have history of gout but takes his allopurinol consistently and states this does not feel similar to his past gout flares.   Past Medical History:  Diagnosis Date   Gout    Hyperlipidemia    Hypertension     Patient Active Problem List   Diagnosis Date Noted   Fever 05/11/2016   Malaise 05/11/2016   Tachypnea 05/11/2016   Essential hypertension    Hyperlipidemia    Sepsis ([place])     Past Surgical History:  Procedure Laterality Date   FOOT SURGERY     KNEE SURGERY     NECK SURGERY         Home Medications    Prior to Admission medications   Medication Sig Start Date End Date Taking? Authorizing Provider  allopurinol (ZYLOPRIM) 100 MG tablet Take 100 mg by mouth daily.    [provider]  amLODipine (NORVASC) 10 MG tablet Take 10 mg by mouth daily.    [provider]  azelastine (ASTELIN) 0.1 % nasal spray Place 1 spray into both nostrils 2 (two) times daily. Use in each nostril as directed 01/09/21   Wurst, [place], PA-C  cetirizine (ZYRTEC) 10 MG tablet Take 1 tablet (10 mg total) by mouth daily. 01/09/21   Wurst, [place], PA-C  Cholecalciferol (VITAMIN D) 2000 units  CAPS Take 1 capsule by mouth daily.    [provider]  HYDROcodone-acetaminophen (NORCO) 10-325 MG tablet Take 1 tablet by mouth every 6 (six) hours as needed for moderate pain.    [provider]  predniSONE (DELTASONE) 20 MG tablet Day 1-3: Take 3 tablets daily. Day 4-6: Take 2 tablets daily. Day 7-9: Take 1 tablet daily. Take tablets daily with breakfast. 07/09/21   Jaynee Eagles, PA-C    Family History Family History  Problem Relation Age of Onset   Heart attack Father    Stroke Mother     Social History Social History   Tobacco Use   Smoking status: Former    Types: Cigarettes   Smokeless tobacco: Never  Substance Use Topics   Alcohol use: Yes    Comment: weekends   Drug use: No     Allergies   Penicillins   Review of Systems Review of Systems Per HPI  Physical Exam Triage Vital Signs ED Triage Vitals  Enc Vitals Group     BP 09/25/21 1059 139/81     Pulse Rate 09/25/21 1059 (!) 102     Resp 09/25/21 1059 18     Temp 09/25/21 1059 98.6 F (37 C)     Temp Source 09/25/21 1059 Oral     SpO2 09/25/21 1059 96 %  Weight --      Height --      Head Circumference --      Peak Flow --      Pain Score 09/25/21 1100 6     Pain Loc --      Pain Edu? --      Excl. in [place]? --    No data found.  Updated Vital Signs BP 139/81 (BP Location: Right Arm)    Pulse 88    Temp 98.6 F (37 C) (Oral)    Resp 18    SpO2 96%   Visual Acuity Right Eye Distance:   Left Eye Distance:   Bilateral Distance:    Right Eye Near:   Left Eye Near:    Bilateral Near:     Physical Exam Vitals and nursing note reviewed.  Constitutional:      Appearance: Normal appearance.  HENT:     Head: Atraumatic.     Mouth/Throat:     Mouth: Mucous membranes are moist.  Eyes:     Extraocular Movements: Extraocular movements intact.     Conjunctiva/sclera: Conjunctivae normal.  Cardiovascular:     Rate and Rhythm: Normal rate and regular rhythm.     Pulses: Normal  pulses.     Heart sounds: Normal heart sounds.  Pulmonary:     Effort: Pulmonary effort is normal.     Breath sounds: Normal breath sounds. No wheezing or rales.  Musculoskeletal:        General: Swelling and tenderness present. No deformity or signs of injury. Normal range of motion.     Cervical back: Normal range of motion and neck supple.     Comments: 1-2+ pitting edema diffuse right lower leg, mild tenderness to palpation.  Negative Homans' sign, negative squeeze test.  Mild bilateral joint line tenderness to palpation right knee, no obvious edema of the knee itself.  No posterior knee tenderness to palpation.  Good range of motion no painful to ambulate per patient  Skin:    General: Skin is warm and dry.     Findings: No bruising, erythema, lesion or rash.  Neurological:     General: No focal deficit present.     Mental Status: He is oriented to person, place, and time.     Comments: Right lower leg neurovascularly intact  Psychiatric:        Mood and Affect: Mood normal.        Thought Content: Thought content normal.        Judgment: Judgment normal.     UC Treatments / Results  Labs (all labs ordered are listed, but only abnormal results are displayed) Labs Reviewed - No data to display  EKG   Radiology No results found.  Procedures Procedures (including critical care time)  Medications Ordered in UC Medications - No data to display  Initial Impression / Assessment and Plan / UC Course  I have reviewed the triage vital signs and the nursing notes.  Pertinent labs & imaging results that were available during my care of the patient were reviewed by me and considered in my medical decision making (see chart for details).       Vital signs today benign and reassuring, mildly tachycardic upon entering into triage but after a few minutes of rest heart rate came down to normal range.  Low suspicion for DVT today, however given ongoing new onset pain, swelling of  the lower leg will perform DVT ultrasound to rule this out.  Possibly related to arthritis flare/inflammatory cause.  Very low suspicion for gout flare.  Has DVT ultrasound scheduled through [facility] vascular for this afternoon, discussed that after receiving the results of this ultrasound I would call patient and discuss a further plan.  He is hemodynamically stable to go via private vehicle to [facility] for this imaging study.  45 minutes spent today in direct patient care, coordination of care and counseling/education.  Final Clinical Impressions(s) / UC Diagnoses   Final diagnoses:  Edema of right lower leg  Right leg pain     Discharge Instructions      To the main entrance of [facility] and let them know that you are there for a vascular ultrasound.  I will call you when I get the results of your study and we will come up with a plan together.    ED Prescriptions   None    PDMP not reviewed this encounter.   Volney American, [place] 09/25/21 1202

## 2021-09-25 NOTE — Discharge Instructions (Signed)
To the main entrance of [facility] and let them know that you are there for a vascular ultrasound.  I will call you when I get the results of your study and we will come up with a plan together.

## 2021-09-25 NOTE — ED Triage Notes (Signed)
Swelling in right lower leg, right knee pain x 2 to 3 weeks.  Hurts to walk

## 2021-10-04 ENCOUNTER — Ambulatory Visit
Admission: EM | Admit: 2021-10-04 | Discharge: 2021-10-04 | Disposition: A | Discharge status: Home / Self Care | Payer: No Typology Code available for payment source | Attending: Family Medicine | Admitting: Family Medicine

## 2021-10-04 ENCOUNTER — Other Ambulatory Visit: Payer: Self-pay

## 2021-10-04 DIAGNOSIS — R051 Acute cough: Secondary | ICD-10-CM | POA: Insufficient documentation

## 2021-10-04 DIAGNOSIS — Z20828 Contact with and (suspected) exposure to other viral communicable diseases: Secondary | ICD-10-CM | POA: Diagnosis present

## 2021-10-04 DIAGNOSIS — J029 Acute pharyngitis, unspecified: Secondary | ICD-10-CM | POA: Diagnosis not present

## 2021-10-04 LAB — POCT RAPID STREP A (OFFICE): Rapid Strep A Screen: NEGATIVE

## 2021-10-04 MED ORDER — BENZONATATE 100 MG PO CAPS: ORAL_CAPSULE | ORAL | 0 refills | Status: DC

## 2021-10-04 NOTE — ED Triage Notes (Signed)
Patient states that he woke this morning with a sore throat and cough  Patient states he took some Tussin at 11am today

## 2021-10-04 NOTE — Discharge Instructions (Addendum)
You have had labs (COVID and flu testing in addition to a throat culture) sent today. We will call you with any significant abnormalities or if there is need to begin or change treatment or pursue further follow up.  You may also review your test results online through MyChart. If you do not have a MyChart account, instructions to sign up should be on your discharge paperwork.

## 2021-10-05 LAB — COVID-19, FLU A+B NAA
Influenza A, NAA: NOT DETECTED
Influenza B, NAA: NOT DETECTED
SARS-CoV-2, NAA: DETECTED — AB

## 2021-10-06 NOTE — ED Provider Notes (Signed)
[facility]   [id number] 10/04/21 Arrival Time: 1153  ASSESSMENT & PLAN:  1. Acute cough   2. Exposure to the flu   3. Sore throat    Discussed typical duration of viral illnesses. Rapid strep negative. Culture sent. COVID/flu testing pending. OTC symptom care as needed. No resp distress.  Discharge Medication List as of 10/04/2021  2:19 PM     START taking these medications   Details  benzonatate (TESSALON) 100 MG capsule Take 1 capsule by mouth every 8 (eight) hours for cough., Normal         Follow-up Information     Schedule an appointment as soon as possible for a visit  with Aliene Beams, MD.   Specialty: Family Medicine Contact information: [address] [place] [address] [phone]                 Reviewed expectations re: course of current medical issues. Questions answered. Outlined signs and symptoms indicating need for more acute intervention. Understanding verbalized. After Visit Summary given.   SUBJECTIVE: History from: patient. Ian Roberson is a 71 y.o. male who reports: waking this am with cough and ST; some fatigue. Denies: fever and difficulty breathing. Normal PO intake without n/v/d.  OBJECTIVE:  Vitals:   10/04/21 1356  BP: 133/76  Pulse: (!) 114  Resp: 18  Temp: 99.4 F (37.4 C)  TempSrc: Oral  SpO2: 96%    Tachycardia noted; recheck P 102  General appearance: alert; no distress Eyes: PERRLA; EOMI; conjunctiva normal HENT: [place]; AT; with nasal congestion; throat mild irritation Neck: supple  Lungs: speaks full sentences without difficulty; unlabored; CTAB Extremities: no edema Skin: warm and dry Neurologic: normal gait Psychological: alert and cooperative; normal mood and affect  Labs: Labs Reviewed  COVID-19, FLU A+B NAA   CULTURE, GROUP A STREP [facility])  POCT RAPID STREP A (OFFICE)   Allergies  Allergen Reactions   Penicillins Other (See Comments)    Convulsions Has patient had a PCN  reaction causing immediate rash, facial/tongue/throat swelling, SOB or lightheadedness with hypotension: No Has patient had a PCN reaction causing severe rash involving mucus membranes or skin necrosis: No Has patient had a PCN reaction that required hospitalization No Has patient had a PCN reaction occurring within the last 10 years: No If all of the above answers are "NO", then may proceed with Cephalosporin use.     Past Medical History:  Diagnosis Date   Gout    Hyperlipidemia    Hypertension    Social History   Socioeconomic History   Marital status: Married    Spouse name: Not on file   Number of children: Not on file   Years of education: Not on file   Highest education level: Not on file  Occupational History   Not on file  Tobacco Use   Smoking status: Former    Types: Cigarettes   Smokeless tobacco: Never  Vaping Use   Vaping Use: Never used  Substance and Sexual Activity   Alcohol use: Yes    Comment: weekends   Drug use: No   Sexual activity: Never  Other Topics Concern   Not on file  Social History Narrative   Not on file   Social Determinants of Health   Financial Resource Strain: Not on file  Food Insecurity: Not on file  Transportation Needs: Not on file  Physical Activity: Not on file  Stress: Not on file  Social Connections: Not on file  Intimate  Partner Violence: Not on file   Family History  Problem Relation Age of Onset   Heart attack Father    Stroke Mother    Past Surgical History:  Procedure Laterality Date   FOOT SURGERY     KNEE SURGERY     NECK SURGERY       Mardella Layman, MD 10/06/21 [phone]

## 2021-10-07 LAB — CULTURE, GROUP A STREP (THRC)

## 2021-10-28 ENCOUNTER — Encounter (HOSPITAL_COMMUNITY): Payer: Self-pay | Admitting: Emergency Medicine

## 2021-10-28 ENCOUNTER — Other Ambulatory Visit: Payer: Self-pay

## 2021-10-28 ENCOUNTER — Emergency Department (HOSPITAL_COMMUNITY)
Admission: EM | Admit: 2021-10-28 | Discharge: 2021-10-28 | Disposition: A | Discharge status: Home / Self Care | Payer: No Typology Code available for payment source | Attending: Emergency Medicine | Admitting: Emergency Medicine

## 2021-10-28 DIAGNOSIS — Z8616 Personal history of COVID-19: Secondary | ICD-10-CM | POA: Insufficient documentation

## 2021-10-28 DIAGNOSIS — Z79899 Other long term (current) drug therapy: Secondary | ICD-10-CM | POA: Insufficient documentation

## 2021-10-28 DIAGNOSIS — H9202 Otalgia, left ear: Secondary | ICD-10-CM | POA: Diagnosis present

## 2021-10-28 DIAGNOSIS — Z7951 Long term (current) use of inhaled steroids: Secondary | ICD-10-CM | POA: Insufficient documentation

## 2021-10-28 DIAGNOSIS — H6982 Other specified disorders of Eustachian tube, left ear: Secondary | ICD-10-CM | POA: Diagnosis not present

## 2021-10-28 DIAGNOSIS — R0981 Nasal congestion: Secondary | ICD-10-CM | POA: Diagnosis not present

## 2021-10-28 DIAGNOSIS — R059 Cough, unspecified: Secondary | ICD-10-CM | POA: Diagnosis not present

## 2021-10-28 MED ORDER — FLUTICASONE PROPIONATE 50 MCG/ACT NA SUSP: 1.0000 | Freq: Every day | NASAL | 2 refills | Status: AC

## 2021-10-28 MED ORDER — SALINE SPRAY 0.65 % NA SOLN: 1.0000 | NASAL | 0 refills | Status: AC | PRN

## 2021-10-28 NOTE — ED Triage Notes (Signed)
Pt c/o ongoing cough and popping of bilateral ears since having covid at the end of Jan.

## 2021-10-28 NOTE — ED Provider Notes (Signed)
[facility] Provider Note   CSN: [id number] Arrival date & time: 10/28/21  0013     History  Chief Complaint  Patient presents with   Otalgia    Derrill MemoJasper Roberson is a 71 y.o. male.  HPI     This is a 71 year old male with recent history of COVID-19 who presents with "clogged ears."  Patient reports that he has had dealt with ear popping ever since he was diagnosed with COVID-19 at the end of January.  He states specifically he is having worsening popping and decreased hearing in the left ear.  He states often he can move his jaw to relieve the pressure; however, he has been unable to do that recently.  He is not using any Flonase or nasal saline for ongoing congestion or eustachian tube dysfunction.  He denies any frank pain.  He has had an ongoing cough but no fevers.  Home Medications Prior to Admission medications   Medication Sig Start Date End Date Taking? Authorizing Provider  fluticasone (FLONASE) 50 MCG/ACT nasal spray Place 1 spray into both nostrils daily. 10/28/21  Yes Alise Calais, Mayer Maskerourtney F, MD  sodium chloride (OCEAN) 0.65 % SOLN nasal spray Place 1 spray into both nostrils as needed for congestion. 10/28/21  Yes Brantley Naser, Mayer Maskerourtney F, MD  albuterol (VENTOLIN HFA) 108 (90 Base) MCG/ACT inhaler INHALE 2 PUFFS BY MOUTH FOUR TIMES A DAY 12/11/20   [provider]  allopurinol (ZYLOPRIM) 100 MG tablet Take 100 mg by mouth daily.    [provider]  amLODipine (NORVASC) 10 MG tablet Take 10 mg by mouth daily.    [provider]  azelastine (ASTELIN) 0.1 % nasal spray Place 1 spray into both nostrils 2 (two) times daily. Use in each nostril as directed 01/09/21   Wurst, [place], PA-C  benzonatate (TESSALON) 100 MG capsule Take 1 capsule by mouth every 8 (eight) hours for cough. 10/04/21   Mardella LaymanHagler, Brian, MD  potassium chloride (MICRO-K) 10 MEQ CR capsule Take 1 capsule by mouth daily. 10/02/21   [provider]  traMADol (ULTRAM) 50 MG  tablet TAKE ONE TABLET BY MOUTH FOUR TIMES A DAY FOR PAIN; REPLACES HYDROCODONE 06/17/21   [provider]      Allergies    Penicillins    Review of Systems   Review of Systems  Constitutional:  Negative for fever.  HENT:  Positive for ear pain and hearing loss.   Respiratory:  Positive for cough. Negative for shortness of breath.   All other systems reviewed and are negative.  Physical Exam Updated Vital Signs BP (!) 170/93 (BP Location: Left Arm)    Pulse 86    Temp 98.1 F (36.7 C) (Oral)    Resp 16    Ht 1.88 m (6\' 2" )    Wt 113.4 kg    SpO2 100%    BMI 32.10 kg/m  Physical Exam Vitals and nursing note reviewed.  Constitutional:      Appearance: He is well-developed. He is not ill-appearing.  HENT:     Head: Normocephalic and atraumatic.     Ears:     Comments: Bilateral TMs clear, nonbulging, light reflex intact    Nose: Congestion present.     Mouth/Throat:     Mouth: Mucous membranes are moist.  Eyes:     Pupils: Pupils are equal, round, and reactive to light.  Cardiovascular:     Rate and Rhythm: Normal rate and regular rhythm.  Pulmonary:  Effort: Pulmonary effort is normal. No respiratory distress.  Abdominal:     Palpations: Abdomen is soft.  Musculoskeletal:     Cervical back: Neck supple.  Lymphadenopathy:     Cervical: No cervical adenopathy.  Skin:    General: Skin is warm and dry.  Neurological:     Mental Status: He is alert and oriented to person, place, and time.  Psychiatric:        Mood and Affect: Mood normal.    ED Results / Procedures / Treatments   Labs (all labs ordered are listed, but only abnormal results are displayed) Labs Reviewed - No data to display  EKG None  Radiology No results found.  Procedures Procedures    Medications Ordered in ED Medications - No data to display  ED Course/ Medical Decision Making/ A&P                           Medical Decision Making Risk OTC drugs.   This patient  presents to the ED for concern of decreased hearing and ear popping, this involves an extensive number of treatment options, and is a complaint that carries with it a high risk of complications and morbidity.  The differential diagnosis includes eustachian tube dysfunction, acute otitis, TM perforation  MDM:    Patient presents with ear popping and decreased hearing.  He is nontoxic and vital signs are reassuring.  He is afebrile.  Recent COVID diagnosis.  He reports ongoing cough but no shortness of breath or fevers.  Exam is largely reassuring.  He has no obvious significant purulent effusion or bulging of the ears.  There is no erythema.  No evidence of infection.  Highly suspect eustachian tube dysfunction.  Recommend Flonase and nasal saline (Labs, imaging)  Labs: I Ordered, and personally interpreted labs.  The pertinent results include: None  Imaging Studies ordered: I ordered imaging studies including none I independently visualized and interpreted imaging. I agree with the radiologist interpretation  Additional history obtained from chart review.  External records from outside source obtained and reviewed including prior visits  Critical Interventions: None  Consultations: I requested consultation with the none,  and discussed lab and imaging findings as well as pertinent plan - they recommend: None  Cardiac Monitoring: The patient was maintained on a cardiac monitor.  I personally viewed and interpreted the cardiac monitored which showed an underlying rhythm of: Normal sinus rhythm  Reevaluation: After the interventions noted above, I reevaluated the patient and found that they have :stayed the same   Considered admission for: N/A  Social Determinants of Health: Lives independent  Disposition: Discharge  Co morbidities that complicate the patient evaluation  Past Medical History:  Diagnosis Date   Gout    Hyperlipidemia    Hypertension      Medicines Meds  ordered this encounter  Medications   fluticasone (FLONASE) 50 MCG/ACT nasal spray    Sig: Place 1 spray into both nostrils daily.    Dispense:  16 g    Refill:  2   sodium chloride (OCEAN) 0.65 % SOLN nasal spray    Sig: Place 1 spray into both nostrils as needed for congestion.    Dispense:  480 mL    Refill:  0    I have reviewed the patients home medicines and have made adjustments as needed  Problem List / ED Course: Problem List Items Addressed This Visit   None Visit Diagnoses  Dysfunction of left eustachian tube    -  Primary                   Final Clinical Impression(s) / ED Diagnoses Final diagnoses:  Dysfunction of left eustachian tube    Rx / DC Orders ED Discharge Orders          Ordered    fluticasone (FLONASE) 50 MCG/ACT nasal spray  Daily        10/28/21 0046    sodium chloride (OCEAN) 0.65 % SOLN nasal spray  As needed        10/28/21 0046              Merryl Hacker, MD 10/28/21 [phone]

## 2021-10-28 NOTE — Discharge Instructions (Signed)
You were seen today for popping of your ears.  This is often related to eustachian tube dysfunction and can be exacerbated with upper respiratory illnesses.  Use Flonase and nasal saline to clear your eustachian tube.

## 2021-11-09 ENCOUNTER — Emergency Department (HOSPITAL_COMMUNITY): Payer: No Typology Code available for payment source

## 2021-11-09 ENCOUNTER — Other Ambulatory Visit: Payer: Self-pay

## 2021-11-09 ENCOUNTER — Emergency Department (HOSPITAL_COMMUNITY)
Admission: EM | Admit: 2021-11-09 | Discharge: 2021-11-09 | Disposition: A | Discharge status: Home / Self Care | Payer: No Typology Code available for payment source | Attending: Emergency Medicine | Admitting: Emergency Medicine

## 2021-11-09 ENCOUNTER — Encounter (HOSPITAL_COMMUNITY): Payer: Self-pay | Admitting: *Deleted

## 2021-11-09 DIAGNOSIS — Z20822 Contact with and (suspected) exposure to covid-19: Secondary | ICD-10-CM | POA: Diagnosis not present

## 2021-11-09 DIAGNOSIS — R509 Fever, unspecified: Secondary | ICD-10-CM | POA: Diagnosis not present

## 2021-11-09 DIAGNOSIS — Z8616 Personal history of COVID-19: Secondary | ICD-10-CM | POA: Diagnosis not present

## 2021-11-09 DIAGNOSIS — J3489 Other specified disorders of nose and nasal sinuses: Secondary | ICD-10-CM | POA: Insufficient documentation

## 2021-11-09 DIAGNOSIS — I10 Essential (primary) hypertension: Secondary | ICD-10-CM | POA: Diagnosis not present

## 2021-11-09 DIAGNOSIS — Z79899 Other long term (current) drug therapy: Secondary | ICD-10-CM | POA: Diagnosis not present

## 2021-11-09 DIAGNOSIS — R0981 Nasal congestion: Secondary | ICD-10-CM | POA: Insufficient documentation

## 2021-11-09 DIAGNOSIS — M7989 Other specified soft tissue disorders: Secondary | ICD-10-CM | POA: Insufficient documentation

## 2021-11-09 DIAGNOSIS — R051 Acute cough: Secondary | ICD-10-CM | POA: Insufficient documentation

## 2021-11-09 DIAGNOSIS — R600 Localized edema: Secondary | ICD-10-CM | POA: Diagnosis not present

## 2021-11-09 DIAGNOSIS — R059 Cough, unspecified: Secondary | ICD-10-CM | POA: Diagnosis present

## 2021-11-09 LAB — CBC WITH DIFFERENTIAL/PLATELET
Abs Immature Granulocytes: 0.02 10*3/uL (ref 0.00–0.07)
Basophils Absolute: 0.1 10*3/uL (ref 0.0–0.1)
Basophils Relative: 1 %
Eosinophils Absolute: 0.4 10*3/uL (ref 0.0–0.5)
Eosinophils Relative: 5 %
HCT: 43.1 % (ref 39.0–52.0)
Hemoglobin: 14.3 g/dL (ref 13.0–17.0)
Immature Granulocytes: 0 %
Lymphocytes Relative: 16 %
Lymphs Abs: 1.3 10*3/uL (ref 0.7–4.0)
MCH: 29.1 pg (ref 26.0–34.0)
MCHC: 33.2 g/dL (ref 30.0–36.0)
MCV: 87.6 fL (ref 80.0–100.0)
Monocytes Absolute: 1.1 10*3/uL — ABNORMAL HIGH (ref 0.1–1.0)
Monocytes Relative: 14 %
Neutro Abs: 5.2 10*3/uL (ref 1.7–7.7)
Neutrophils Relative %: 64 %
Platelets: 226 10*3/uL (ref 150–400)
RBC: 4.92 MIL/uL (ref 4.22–5.81)
RDW: 12.6 % (ref 11.5–15.5)
WBC: 8 10*3/uL (ref 4.0–10.5)
nRBC: 0 % (ref 0.0–0.2)

## 2021-11-09 LAB — BASIC METABOLIC PANEL
Anion gap: 7 (ref 5–15)
BUN: 10 mg/dL (ref 8–23)
CO2: 26 mmol/L (ref 22–32)
Calcium: 9.3 mg/dL (ref 8.9–10.3)
Chloride: 102 mmol/L (ref 98–111)
Creatinine, Ser: 0.82 mg/dL (ref 0.61–1.24)
GFR, Estimated: >60 mL/min (ref >60)
Glucose, Bld: 101 mg/dL — ABNORMAL HIGH (ref 70–99)
Potassium: 3.8 mmol/L (ref 3.5–5.1)
Sodium: 135 mmol/L (ref 135–145)

## 2021-11-09 LAB — RESP PANEL BY RT-PCR (FLU A&B, COVID) ARPGX2
Influenza A by PCR: NEGATIVE
Influenza B by PCR: NEGATIVE
SARS Coronavirus 2 by RT PCR: NEGATIVE

## 2021-11-09 MED ORDER — DOXYCYCLINE HYCLATE 100 MG PO CAPS: 100.0000 mg | ORAL_CAPSULE | Freq: Two times a day (BID) | ORAL | 0 refills | Status: DC

## 2021-11-09 MED ORDER — BENZONATATE 200 MG PO CAPS: 200.0000 mg | ORAL_CAPSULE | Freq: Three times a day (TID) | ORAL | 0 refills | Status: DC | PRN

## 2021-11-09 NOTE — ED Provider Notes (Signed)
?[facility] ?Provider Note ? ? ?CSN: [id number] ?Arrival date & time: 11/09/21  1853 ? ?  ? ?History ? ?Chief Complaint  ?Patient presents with  ? Cough  ? ? ?Ian Roberson is a 71 y.o. male. ? ? ?Cough ?Associated symptoms: fever and rhinorrhea   ?Associated symptoms: no chest pain, no chills, no rash and no shortness of breath   ? ?  ? ? ?Ian Roberson is a 71 y.o. male with past medical history significant for hypertension, hyperlipidemia and gout who presents to the Emergency Department complaining of gradually worsening cough, nasal congestion and burning sensation of his sinuses.  Endorses 1 day of low-grade fever.  Symptoms have been present for 2 days.  Cough is mostly nonproductive and worse at night.  He has been unable to sleep last evening due to persistent coughing.  Had COVID in January.  Has been vaccinated and boosted.  Denies any chest pain or shortness of breath.  He does endorse having some bilateral lower extremity swelling with right greater than left.  No exertional dyspnea.  Denies any history of CHF or cardiac issues. ? ? ? ?Home Medications ?Prior to Admission medications   ?Medication Sig Start Date End Date Taking? Authorizing Provider  ?albuterol (VENTOLIN HFA) 108 (90 Base) MCG/ACT inhaler INHALE 2 PUFFS BY MOUTH FOUR TIMES A DAY 12/11/20   [provider]  ?allopurinol (ZYLOPRIM) 100 MG tablet Take 100 mg by mouth daily.    [provider]  ?amLODipine (NORVASC) 10 MG tablet Take 10 mg by mouth daily.    [provider]  ?azelastine (ASTELIN) 0.1 % nasal spray Place 1 spray into both nostrils 2 (two) times daily. Use in each nostril as directed 01/09/21   Wurst, [place], PA-C  ?benzonatate (TESSALON) 100 MG capsule Take 1 capsule by mouth every 8 (eight) hours for cough. 10/04/21   Mardella Layman, MD  ?fluticasone (FLONASE) 50 MCG/ACT nasal spray Place 1 spray into both nostrils daily. 10/28/21   Horton, Mayer Masker, MD  ?potassium chloride  (MICRO-K) 10 MEQ CR capsule Take 1 capsule by mouth daily. 10/02/21   [provider]  ?sodium chloride (OCEAN) 0.65 % SOLN nasal spray Place 1 spray into both nostrils as needed for congestion. 10/28/21   Horton, Mayer Masker, MD  ?traMADol (ULTRAM) 50 MG tablet TAKE ONE TABLET BY MOUTH FOUR TIMES A DAY FOR PAIN; REPLACES HYDROCODONE 06/17/21   [provider]  ?   ? ?Allergies    ?Penicillins   ? ?Review of Systems   ?Review of Systems  ?Constitutional:  Positive for fever. Negative for appetite change and chills.  ?HENT:  Positive for congestion, rhinorrhea and sinus pain. Negative for trouble swallowing.   ?Respiratory:  Positive for cough. Negative for chest tightness and shortness of breath.   ?Cardiovascular:  Positive for leg swelling. Negative for chest pain.  ?Gastrointestinal:  Negative for abdominal pain, nausea and vomiting.  ?Genitourinary:  Negative for dysuria.  ?Musculoskeletal:  Negative for arthralgias.  ?Skin:  Negative for color change and rash.  ?Neurological:  Negative for weakness and numbness.  ? ?Physical Exam ?Updated Vital Signs ?BP 111/89 (BP Location: Right Arm)   Pulse (!) 102   Temp 98.9 ?F (37.2 ?C) (Oral)   Resp (!) 24   Ht 6\' 2"  (1.88 m)   Wt 101.6 kg   SpO2 97%   BMI 28.76 kg/m?  ?Physical Exam ?Vitals and nursing note reviewed.  ?Constitutional:   ?   General:  He is not in acute distress. ?   Appearance: Normal appearance.  ?HENT:  ?   Right Ear: Tympanic membrane and ear canal normal.  ?   Left Ear: Tympanic membrane and ear canal normal.  ?   Mouth/Throat:  ?   Mouth: Mucous membranes are moist.  ?   Pharynx: Oropharynx is clear. No posterior oropharyngeal erythema.  ?Cardiovascular:  ?   Rate and Rhythm: Normal rate and regular rhythm.  ?   Pulses: Normal pulses.  ?Pulmonary:  ?   Effort: Pulmonary effort is normal. No respiratory distress.  ?   Breath sounds: Normal breath sounds.  ?Musculoskeletal:     ?   General: No tenderness.  ?   Right lower leg:  Edema present.  ?   Left lower leg: Edema present.  ?Skin: ?   General: Skin is warm.  ?   Capillary Refill: Capillary refill takes less than 2 seconds.  ?   Findings: No rash.  ?Neurological:  ?   General: No focal deficit present.  ?   Mental Status: He is alert.  ?   Sensory: No sensory deficit.  ?   Motor: No weakness.  ? ? ?ED Results / Procedures / Treatments   ?Labs ?(all labs ordered are listed, but only abnormal results are displayed) ?Labs Reviewed  ?CBC WITH DIFFERENTIAL/PLATELET - Abnormal; Notable for the following components:  ?    Result Value  ? Monocytes Absolute 1.1 (*)   ? All other components within normal limits  ?BASIC METABOLIC PANEL - Abnormal; Notable for the following components:  ? Glucose, Bld 101 (*)   ? All other components within normal limits  ?RESP PANEL BY RT-PCR (FLU A&B, COVID) [id number]  ? ? ?EKG ?None ? ?Radiology ?DG Chest Portable 1 View ? ?Result Date: 11/09/2021 ?CLINICAL DATA:  Cough.  Sore throat. EXAM: PORTABLE CHEST 1 VIEW COMPARISON:  10/01/2016. FINDINGS: Upper normal heart size.The cardiomediastinal contours are normal. The lungs are clear. Pulmonary vasculature is normal. No consolidation, pleural effusion, or pneumothorax. No acute osseous abnormalities are seen. IMPRESSION: No acute chest findings. Upper normal heart size likely accentuated by portable AP technique. Electronically Signed   By: Narda Rutherford M.D.   On: 11/09/2021 20:35   ? ?Procedures ?Procedures  ? ? ?Medications Ordered in ED ?Medications - No data to display ? ?ED Course/ Medical Decision Making/ A&P ?  ?                        ?Medical Decision Making ?Amount and/or Complexity of Data Reviewed ?Labs: ordered. ?Radiology: ordered. ? ?Risk ?Prescription drug management. ? ? ?Patient here for evaluation of cough sinus congestion and fever for 24 hours.  Cough is described as productive of white sputum.  Patient spouse also here for evaluation of similar symptoms.  She was diagnosed on 3/2 with  pneumonia.  He is well-appearing, does not appear toxic.  No increased work of breathing on exam and lungs are clear.  Will check labs including COVID and influenza and obtain chest x-ray.  I anticipate discharge home. ? ?On recheck, patient resting comfortably.  Chest x-ray without evidence of acute cardiopulmonary process.  Labs show no evidence of leukocytosis.  Hemoglobin unremarkable.  Chemistries without acute derangement.  COVID and flu test are negative. ? ?Cough with 1 day history of fever, this could be viral process but given patient's age and reported history of fever I feel that it is reasonable to  treat with antibiotics.  Will prescribe doxycycline, Tessalon Perles for cough.  He has PCP through the [facility].  I feel that he is appropriate for discharge home at this time.  Return precautions were discussed and he will follow-up with PCP for recheck ? ? ? ? ? ? ? ?Final Clinical Impression(s) / ED Diagnoses ?Final diagnoses:  ?Acute cough  ? ? ?Rx / DC Orders ?ED Discharge Orders   ? ? None  ? ?  ? ? ?  ?Pauline Aus, PA-C ?11/09/21 2259 ? ?  ?Gloris Manchester, MD ?11/14/21 1131 ? ?

## 2021-11-09 NOTE — ED Triage Notes (Signed)
Pt with cough x 2 nights.  + sore throat, burning in sinus, fever the other day per pt.  ?Covid positive on 10/04/21 ?

## 2021-11-09 NOTE — Discharge Instructions (Signed)
Your COVID and flu test were negative.  Your chest x-ray did not show evidence of pneumonia.  Please take the medications as directed.  Please follow-up with your primary care provider for recheck.  Return to the emergency department for any new or worsening symptoms. ?

## 2021-11-30 ENCOUNTER — Other Ambulatory Visit: Payer: Self-pay

## 2021-11-30 ENCOUNTER — Ambulatory Visit
Admission: EM | Admit: 2021-11-30 | Discharge: 2021-11-30 | Disposition: A | Discharge status: Home / Self Care | Payer: No Typology Code available for payment source

## 2021-11-30 DIAGNOSIS — M7989 Other specified soft tissue disorders: Secondary | ICD-10-CM | POA: Diagnosis not present

## 2021-11-30 NOTE — Discharge Instructions (Addendum)
-  Please call the number to schedule the ultrasound of your lower extremity.  I am primarily concerned about a blood clot.  If this ultrasound is negative, follow-up with your PCP to discuss joint pain. ?-Always head to the emergency department if you develop chest pain, shortness of breath, dizziness, weakness. ?

## 2021-11-30 NOTE — ED Provider Notes (Signed)
?[facility] ? ? ? ?CSN: [id number] ?Arrival date & time: 11/30/21  1001 ? ? ?  ? ?History   ?Chief Complaint ?Chief Complaint  ?Patient presents with  ? Joint Pain  ? ? ?HPI ?Ian Roberson is a 71 y.o. male presenting with unilateral R leg swelling x2 weeks with new onset of R knee pain. Chart review indicates he has actually had RLE swelling for few months, but the knee pain is new. DVT US was negative 09/25/21.  He does also have a history of gout for which he takes daily allopurinol, he is still taking this as directed.  States he eats red meat about twice weekly, but denies beer or seafood.  States that his ankle is painful, but not exquisitely tender and does not feel like gout.  Denies any shortness of breath, chest pain, fever/chills.  Denies trauma to the leg, falls, overuse. ? ?HPI ? ?Past Medical History:  ?Diagnosis Date  ? Gout   ? Hyperlipidemia   ? Hypertension   ? ? ?Patient Active Problem List  ? Diagnosis Date Noted  ? Fever 05/11/2016  ? Malaise 05/11/2016  ? Tachypnea 05/11/2016  ? Essential hypertension   ? Hyperlipidemia   ? Sepsis (HCC)   ? ? ?Past Surgical History:  ?Procedure Laterality Date  ? FOOT SURGERY    ? KNEE SURGERY    ? NECK SURGERY    ? ? ? ? ? ?Home Medications   ? ?Prior to Admission medications   ?Medication Sig Start Date End Date Taking? Authorizing Provider  ?albuterol (VENTOLIN HFA) 108 (90 Base) MCG/ACT inhaler INHALE 2 PUFFS BY MOUTH FOUR TIMES A DAY 12/11/20   [provider]  ?allopurinol (ZYLOPRIM) 100 MG tablet Take 100 mg by mouth daily.    [provider]  ?amLODipine (NORVASC) 10 MG tablet Take 10 mg by mouth daily.    [provider]  ?azelastine (ASTELIN) 0.1 % nasal spray Place 1 spray into both nostrils 2 (two) times daily. Use in each nostril as directed 01/09/21   Wurst, [place], PA-C  ?doxycycline (VIBRAMYCIN) 100 MG capsule Take 1 capsule (100 mg total) by mouth 2 (two) times daily. 11/09/21   Triplett, Tammy, PA-C   ?fluticasone (FLONASE) 50 MCG/ACT nasal spray Place 1 spray into both nostrils daily. 10/28/21   Horton, Mayer Masker, MD  ?potassium chloride (MICRO-K) 10 MEQ CR capsule Take 1 capsule by mouth daily. 10/02/21   [provider]  ?sodium chloride (OCEAN) 0.65 % SOLN nasal spray Place 1 spray into both nostrils as needed for congestion. 10/28/21   Horton, Mayer Masker, MD  ?traMADol (ULTRAM) 50 MG tablet TAKE ONE TABLET BY MOUTH FOUR TIMES A DAY FOR PAIN; REPLACES HYDROCODONE 06/17/21   [provider]  ? ? ?Family History ?Family History  ?Problem Relation Age of Onset  ? Heart attack Father   ? Stroke Mother   ? ? ?Social History ?Social History  ? ?Tobacco Use  ? Smoking status: Former  ?  Types: Cigarettes  ? Smokeless tobacco: Never  ?Vaping Use  ? Vaping Use: Never used  ?Substance Use Topics  ? Alcohol use: Yes  ?  Comment: weekends  ? Drug use: No  ? ? ? ?Allergies   ?Penicillins ? ? ?Review of Systems ?Review of Systems  ?Musculoskeletal:   ?     R LE swelling and pain   ?All other systems reviewed and are negative. ? ? ?Physical Exam ?Triage Vital Signs ?ED Triage Vitals  ?  Enc Vitals Group  ?   BP 11/30/21 1100 138/84  ?   Pulse Rate 11/30/21 1100 85  ?   Resp 11/30/21 1100 16  ?   Temp 11/30/21 1100 98.6 ?F (37 ?C)  ?   Temp Source 11/30/21 1100 Oral  ?   SpO2 11/30/21 1100 97 %  ?   Weight --   ?   Height --   ?   Head Circumference --   ?   Peak Flow --   ?   Pain Score 11/30/21 1058 6  ?   Pain Loc --   ?   Pain Edu? --   ?   Excl. in GC? --   ? ?No data found. ? ?Updated Vital Signs ?BP 138/84 (BP Location: Right Arm)   Pulse 85   Temp 98.6 ?F (37 ?C) (Oral)   Resp 16   SpO2 97%  ? ?Visual Acuity ?Right Eye Distance:   ?Left Eye Distance:   ?Bilateral Distance:   ? ?Right Eye Near:   ?Left Eye Near:    ?Bilateral Near:    ? ?Physical Exam ?Vitals reviewed.  ?Constitutional:   ?   General: He is not in acute distress. ?   Appearance: Normal appearance. He is not ill-appearing.  ?HENT:  ?    Head: Normocephalic and atraumatic.  ?Cardiovascular:  ?   Rate and Rhythm: Normal rate.  ?   Comments: Right lower extremity with 2+ pedal edema; left lower extremity with 1+ pedal edema.  Edema extends to lower calf bilaterally.  DP pulses 2+ bilaterally, cap refill less than 2 seconds.  Right ankle measures 3 cm greater than left; right calf measures 2 cm greater than left; knees are equal and symmetric, without laxity or crepitus. ?Pulmonary:  ?   Effort: Pulmonary effort is normal.  ?Neurological:  ?   General: No focal deficit present.  ?   Mental Status: He is alert and oriented to person, place, and time.  ?Psychiatric:     ?   Mood and Affect: Mood normal.     ?   Behavior: Behavior normal.     ?   Thought Content: Thought content normal.     ?   Judgment: Judgment normal.  ? ? ? ?UC Treatments / Results  ?Labs ?(all labs ordered are listed, but only abnormal results are displayed) ?Labs Reviewed - No data to display ? ?EKG ? ? ?Radiology ?No results found. ? ?Procedures ?Procedures (including critical care time) ? ?Medications Ordered in UC ?Medications - No data to display ? ?Initial Impression / Assessment and Plan / UC Course  ?I have reviewed the triage vital signs and the nursing notes. ? ?Pertinent labs & imaging results that were available during my care of the patient were reviewed by me and considered in my medical decision making (see chart for details). ? ?  ? ?This patient is a very pleasant 71 y.o. year old male presenting with unilateral R LE swelling.  Swelling has been present for about 2 months, with negative DVT ultrasound 09/25/2021.  However, he has new onset of right knee pain.  DDx includes arthritis, but given continued swelling, will check another DVT study to rule out clot.  Unfortunately, as it is the weekend, cannot have this done today.  Provided him with information, and advised him to call at 8 AM tomorrow morning to schedule this.  If any new chest pain or shortness of  breath prior to then,  head straight to the emergency department.  DDx also includes gout, however he takes daily allopurinol for prevention of gout, and the ankle is not exquisitely tender or consistent with past gout flares.  Recommended following up with PCP to discuss further if DVT study is negative.  ? ?Final Clinical Impressions(s) / UC Diagnoses  ? ?Final diagnoses:  ?Right leg swelling  ? ? ? ?Discharge Instructions   ? ?  ?-Please call the number to schedule the ultrasound of your lower extremity.  I am primarily concerned about a blood clot.  If this ultrasound is negative, follow-up with your PCP to discuss joint pain. ?-Always head to the emergency department if you develop chest pain, shortness of breath, dizziness, weakness. ? ? ?ED Prescriptions   ?None ?  ? ?PDMP not reviewed this encounter. ?  ?Rhys Martini, PA-C ?11/30/21 1217 ? ?

## 2021-11-30 NOTE — ED Triage Notes (Signed)
Pt states that his joints have been hurting for 2 weeks ? ?Pt states he started with the swelling of the right ankle and now has traveled to his joints ? ?Pt states he has only taken his prescribed meds without any relief ?

## 2021-12-05 ENCOUNTER — Other Ambulatory Visit: Payer: Self-pay

## 2021-12-05 ENCOUNTER — Encounter (HOSPITAL_COMMUNITY): Payer: Self-pay | Admitting: Emergency Medicine

## 2021-12-05 ENCOUNTER — Emergency Department (HOSPITAL_COMMUNITY)
Admission: EM | Admit: 2021-12-05 | Discharge: 2021-12-06 | Disposition: A | Discharge status: Home / Self Care | Payer: No Typology Code available for payment source | Attending: Emergency Medicine | Admitting: Emergency Medicine

## 2021-12-05 ENCOUNTER — Emergency Department (HOSPITAL_COMMUNITY): Payer: No Typology Code available for payment source

## 2021-12-05 DIAGNOSIS — Z79899 Other long term (current) drug therapy: Secondary | ICD-10-CM | POA: Diagnosis not present

## 2021-12-05 DIAGNOSIS — R0789 Other chest pain: Secondary | ICD-10-CM | POA: Diagnosis not present

## 2021-12-05 DIAGNOSIS — I10 Essential (primary) hypertension: Secondary | ICD-10-CM | POA: Diagnosis not present

## 2021-12-05 DIAGNOSIS — M79621 Pain in right upper arm: Secondary | ICD-10-CM | POA: Insufficient documentation

## 2021-12-05 DIAGNOSIS — M7989 Other specified soft tissue disorders: Secondary | ICD-10-CM | POA: Diagnosis not present

## 2021-12-05 DIAGNOSIS — E119 Type 2 diabetes mellitus without complications: Secondary | ICD-10-CM | POA: Diagnosis not present

## 2021-12-05 DIAGNOSIS — R079 Chest pain, unspecified: Secondary | ICD-10-CM | POA: Diagnosis present

## 2021-12-05 HISTORY — DX: Type 2 diabetes mellitus without complications: E11.9

## 2021-12-05 LAB — BASIC METABOLIC PANEL
Anion gap: 6 (ref 5–15)
BUN: 12 mg/dL (ref 8–23)
CO2: 25 mmol/L (ref 22–32)
Calcium: 9.5 mg/dL (ref 8.9–10.3)
Chloride: 106 mmol/L (ref 98–111)
Creatinine, Ser: 0.83 mg/dL (ref 0.61–1.24)
GFR, Estimated: >60 mL/min (ref >60)
Glucose, Bld: 141 mg/dL — ABNORMAL HIGH (ref 70–99)
Potassium: 3.8 mmol/L (ref 3.5–5.1)
Sodium: 137 mmol/L (ref 135–145)

## 2021-12-05 LAB — CBC
HCT: 42.1 % (ref 39.0–52.0)
Hemoglobin: 14 g/dL (ref 13.0–17.0)
MCH: 28.6 pg (ref 26.0–34.0)
MCHC: 33.3 g/dL (ref 30.0–36.0)
MCV: 86.1 fL (ref 80.0–100.0)
Platelets: 218 10*3/uL (ref 150–400)
RBC: 4.89 MIL/uL (ref 4.22–5.81)
RDW: 12.8 % (ref 11.5–15.5)
WBC: 7.9 10*3/uL (ref 4.0–10.5)
nRBC: 0 % (ref 0.0–0.2)

## 2021-12-05 LAB — TROPONIN I (HIGH SENSITIVITY): Troponin I (High Sensitivity): 7 ng/L (ref <18)

## 2021-12-05 MED ORDER — IOHEXOL 350 MG/ML SOLN
100.0000 mL | Freq: Once | INTRAVENOUS | Status: AC | PRN
  Administered 2021-12-06: 100 mL via INTRAVENOUS

## 2021-12-05 NOTE — ED Provider Notes (Signed)
?[facility] ?Provider Note ? ? ?CSN: [id number] ?Arrival date & time: 12/05/21  2207 ? ?  ? ?History ? ?Chief Complaint  ?Patient presents with  ? Chest Pain  ? ? ?Ian Roberson is a 71 y.o. male. ? ?Patient is a 71 year old male with past medical history of hypertension, hyperlipidemia, type 2 diabetes.  Patient presenting today for evaluation of chest discomfort.  He reports pain to the right side of his chest and upper arm that began this morning.  The pain lasted several hours, then resolved.  It returned again around dinnertime and has persisted throughout the evening.  It is not associated with any shortness of breath, nausea, or diaphoresis.  He denies any weakness or numbness.  Patient does describe swelling in the right leg, but tells me this has been present since January when he was diagnosed with COVID.  He reports being worked up for a blood clot, but studies were negative. ? ?The history is provided by the patient.  ?Chest Pain ?Pain location:  R chest ?Pain quality: aching   ?Pain radiates to:  R arm ?Pain severity:  Moderate ?Onset quality:  Sudden ?Duration:  12 hours ?Timing:  Intermittent ?Progression:  Resolved ?Chronicity:  New ?Relieved by:  Nothing ?Worsened by:  Nothing ?Ineffective treatments:  None tried ? ?  ? ?Home Medications ?Prior to Admission medications   ?Medication Sig Start Date End Date Taking? Authorizing Provider  ?albuterol (VENTOLIN HFA) 108 (90 Base) MCG/ACT inhaler INHALE 2 PUFFS BY MOUTH FOUR TIMES A DAY 12/11/20   [provider]  ?allopurinol (ZYLOPRIM) 100 MG tablet Take 100 mg by mouth daily.    [provider]  ?amLODipine (NORVASC) 10 MG tablet Take 10 mg by mouth daily.    [provider]  ?azelastine (ASTELIN) 0.1 % nasal spray Place 1 spray into both nostrils 2 (two) times daily. Use in each nostril as directed 01/09/21   Wurst, [place], PA-C  ?doxycycline (VIBRAMYCIN) 100 MG capsule Take 1 capsule (100 mg total) by  mouth 2 (two) times daily. 11/09/21   Triplett, Tammy, PA-C  ?fluticasone (FLONASE) 50 MCG/ACT nasal spray Place 1 spray into both nostrils daily. 10/28/21   Horton, Mayer Masker, MD  ?potassium chloride (MICRO-K) 10 MEQ CR capsule Take 1 capsule by mouth daily. 10/02/21   [provider]  ?sodium chloride (OCEAN) 0.65 % SOLN nasal spray Place 1 spray into both nostrils as needed for congestion. 10/28/21   Horton, Mayer Masker, MD  ?traMADol (ULTRAM) 50 MG tablet TAKE ONE TABLET BY MOUTH FOUR TIMES A DAY FOR PAIN; REPLACES HYDROCODONE 06/17/21   [provider]  ?   ? ?Allergies    ?Penicillins   ? ?Review of Systems   ?Review of Systems  ?Cardiovascular:  Positive for chest pain.  ?All other systems reviewed and are negative. ? ?Physical Exam ?Updated Vital Signs ?BP (!) 151/84 (BP Location: Left Arm)   Pulse 94   Temp 98 ?F (36.7 ?C) (Oral)   Resp 18   Ht 6\' 2"  (1.88 m)   Wt 101.6 kg   SpO2 97%   BMI 28.76 kg/m?  ?Physical Exam ?Vitals and nursing note reviewed.  ?Constitutional:   ?   General: He is not in acute distress. ?   Appearance: He is well-developed. He is not diaphoretic.  ?HENT:  ?   Head: Normocephalic and atraumatic.  ?Cardiovascular:  ?   Rate and Rhythm: Normal rate and regular rhythm.  ?   Heart  sounds: No murmur heard. ?  No friction rub.  ?Pulmonary:  ?   Effort: Pulmonary effort is normal. No respiratory distress.  ?   Breath sounds: Normal breath sounds. No wheezing or rales.  ?Abdominal:  ?   General: Bowel sounds are normal. There is no distension.  ?   Palpations: Abdomen is soft.  ?   Tenderness: There is no abdominal tenderness.  ?Musculoskeletal:     ?   General: Normal range of motion.  ?   Cervical back: Normal range of motion and neck supple.  ?   Left lower leg: No tenderness. No edema.  ?   Comments: There is 1-2+ pitting edema of the right lower extremity.  There is mild tenderness in the calf.  Denna Haggard' sign absent.  ?Skin: ?   General: Skin is warm and dry.   ?Neurological:  ?   Mental Status: He is alert and oriented to person, place, and time.  ?   Coordination: Coordination normal.  ? ? ?ED Results / Procedures / Treatments   ?Labs ?(all labs ordered are listed, but only abnormal results are displayed) ?Labs Reviewed  ?BASIC METABOLIC PANEL - Abnormal; Notable for the following components:  ?    Result Value  ? Glucose, Bld 141 (*)   ? All other components within normal limits  ?CBC  ?TROPONIN I (HIGH SENSITIVITY)  ? ? ?EKG ?EKG Interpretation ? ?Date/Time:  Friday December 05 2021 22:24:35 EDT ?Ventricular Rate:  90 ?PR Interval:  197 ?QRS Duration: 108 ?QT Interval:  369 ?QTC Calculation: 452 ?R Axis:   -48 ?Text Interpretation: Sinus rhythm Left anterior fascicular block Left ventricular hypertrophy Anterior Q waves, possibly due to LVH Confirmed by Benjiman Core [phone]) on 12/05/2021 10:45:01 PM ? ?Radiology ?DG Chest 2 View ? ?Result Date: 12/05/2021 ?CLINICAL DATA:  chest pain EXAM: CHEST - 2 VIEW COMPARISON:  Chest x-ray 11/09/2021 FINDINGS: The heart and mediastinal contours are within normal limits. No focal consolidation. No pulmonary edema. No pleural effusion. No pneumothorax. The right glenohumeral joint appears malaligned which may be positional. IMPRESSION: 1. No active cardiopulmonary disease. 2. The right glenohumeral joint appears malaligned which may be positional. Correlate with physical exam. Electronically Signed   By: Tish Frederickson M.D.   On: 12/05/2021 22:52   ? ?Procedures ?Procedures  ?Continuous cardiac monitoring ? ?Medications Ordered in ED ?Medications - No data to display ? ?ED Course/ Medical Decision Making/ A&P ? ?This patient presents to the ED for concern of chest discomfort, this involves an extensive number of treatment options, and is a complaint that carries with it a high risk of complications and morbidity.  The differential diagnosis includes acute coronary syndrome, pulmonary embolism, aortic dissection, pneumothorax,  costochondritis ? ? ?Co morbidities that complicate the patient evaluation ? ?None ? ? ?Additional history obtained: ? ?No additional history or external records needed ? ? ?Lab Tests: ? ?I Ordered, and personally interpreted labs.  The pertinent results include: Unremarkable CBC, metabolic panel, and troponin x2 ? ? ?Imaging Studies ordered: ? ?I ordered imaging studies including chest x-ray and CTA of the chest ?I independently visualized and interpreted imaging which showed no acute intrathoracic process ?I agree with the radiologist interpretation ? ? ?Cardiac Monitoring: / EKG: ? ?The patient was maintained on a cardiac monitor.  I personally viewed and interpreted the cardiac monitored which showed an underlying rhythm of: Sinus ? ? ?Consultations Obtained: ? ?No emergent consultations obtained, but patient will be referred to cardiology as  an outpatient. ? ? ?Problem List / ED Course / Critical interventions / Medication management ? ?Patient presenting with chest discomfort as described in the HPI.  Patient pain-free here in the ER without intervention.  His work-up shows no evidence for a cardiac etiology or evidence for dissection or pulmonary embolism.  Symptoms may be musculoskeletal in nature, but nothing appears emergent.  I feel as though patient can safely be discharged with outpatient cardiology follow-up and return as needed. ?No medications ordered or given ?I have reviewed the patients home medicines and have made adjustments as needed ? ? ?Social Determinants of Health: ? ?None ? ? ?Test / Admission - Considered: ? ?No other test considered.  I do not feel as though admission is required, but patient understands to return if symptoms worsen or change. ? ? ?Final Clinical Impression(s) / ED Diagnoses ?Final diagnoses:  ?None  ? ? ?Rx / DC Orders ?ED Discharge Orders   ? ? None  ? ?  ? ? ?  ?Geoffery Lyonselo, Alonnie Bieker, MD ?12/06/21 [phone] ? ?

## 2021-12-05 NOTE — ED Triage Notes (Signed)
Pt reports intermittent right and center chest pain x 3 hours with radiation to the right arm  ?

## 2021-12-06 LAB — TROPONIN I (HIGH SENSITIVITY): Troponin I (High Sensitivity): 7 ng/L (ref <18)

## 2021-12-06 NOTE — Discharge Instructions (Signed)
Continue medications as previously prescribed. ? ?Rest. ? ?Follow-up with cardiology next week.  The contact information for the cardiology clinic here in [place] has been provided in this discharge summary for you to call and make these arrangements. ? ?Return to the emergency department in the meantime if symptoms significantly worsen or change. ?

## 2021-12-06 NOTE — ED Notes (Signed)
ED Provider at bedside. 

## 2021-12-06 NOTE — ED Notes (Signed)
Patient transported to CT 

## 2022-01-25 ENCOUNTER — Encounter (HOSPITAL_COMMUNITY): Payer: Self-pay

## 2022-01-25 ENCOUNTER — Other Ambulatory Visit: Payer: Self-pay

## 2022-01-25 ENCOUNTER — Emergency Department (HOSPITAL_COMMUNITY): Payer: No Typology Code available for payment source

## 2022-01-25 ENCOUNTER — Observation Stay (HOSPITAL_COMMUNITY)
Admission: EM | Admit: 2022-01-25 | Discharge: 2022-01-26 | Disposition: A | Discharge status: Home / Self Care | Payer: No Typology Code available for payment source | Attending: Internal Medicine | Admitting: Internal Medicine

## 2022-01-25 DIAGNOSIS — E119 Type 2 diabetes mellitus without complications: Secondary | ICD-10-CM | POA: Diagnosis not present

## 2022-01-25 DIAGNOSIS — Z87891 Personal history of nicotine dependence: Secondary | ICD-10-CM | POA: Insufficient documentation

## 2022-01-25 DIAGNOSIS — R42 Dizziness and giddiness: Secondary | ICD-10-CM | POA: Diagnosis present

## 2022-01-25 DIAGNOSIS — G250 Essential tremor: Secondary | ICD-10-CM | POA: Insufficient documentation

## 2022-01-25 DIAGNOSIS — R2241 Localized swelling, mass and lump, right lower limb: Secondary | ICD-10-CM | POA: Diagnosis not present

## 2022-01-25 DIAGNOSIS — M109 Gout, unspecified: Secondary | ICD-10-CM

## 2022-01-25 DIAGNOSIS — I639 Cerebral infarction, unspecified: Secondary | ICD-10-CM | POA: Insufficient documentation

## 2022-01-25 DIAGNOSIS — R2681 Unsteadiness on feet: Secondary | ICD-10-CM | POA: Diagnosis not present

## 2022-01-25 DIAGNOSIS — Z20822 Contact with and (suspected) exposure to covid-19: Secondary | ICD-10-CM | POA: Insufficient documentation

## 2022-01-25 DIAGNOSIS — Z79899 Other long term (current) drug therapy: Secondary | ICD-10-CM | POA: Insufficient documentation

## 2022-01-25 DIAGNOSIS — I10 Essential (primary) hypertension: Secondary | ICD-10-CM | POA: Diagnosis not present

## 2022-01-25 LAB — BASIC METABOLIC PANEL
Anion gap: 7 (ref 5–15)
BUN: 11 mg/dL (ref 8–23)
CO2: 24 mmol/L (ref 22–32)
Calcium: 9.8 mg/dL (ref 8.9–10.3)
Chloride: 108 mmol/L (ref 98–111)
Creatinine, Ser: 0.85 mg/dL (ref 0.61–1.24)
GFR, Estimated: >60 mL/min (ref >60)
Glucose, Bld: 107 mg/dL — ABNORMAL HIGH (ref 70–99)
Potassium: 4 mmol/L (ref 3.5–5.1)
Sodium: 139 mmol/L (ref 135–145)

## 2022-01-25 LAB — ETHANOL: Alcohol, Ethyl (B): <10 mg/dL (ref <10)

## 2022-01-25 LAB — CBC
HCT: 41.4 % (ref 39.0–52.0)
Hemoglobin: 13.6 g/dL (ref 13.0–17.0)
MCH: 28.1 pg (ref 26.0–34.0)
MCHC: 32.9 g/dL (ref 30.0–36.0)
MCV: 85.5 fL (ref 80.0–100.0)
Platelets: 197 10*3/uL (ref 150–400)
RBC: 4.84 MIL/uL (ref 4.22–5.81)
RDW: 14.6 % (ref 11.5–15.5)
WBC: 8.6 10*3/uL (ref 4.0–10.5)
nRBC: 0 % (ref 0.0–0.2)

## 2022-01-25 LAB — DIFFERENTIAL
Abs Immature Granulocytes: 0.02 10*3/uL (ref 0.00–0.07)
Basophils Absolute: 0.1 10*3/uL (ref 0.0–0.1)
Basophils Relative: 1 %
Eosinophils Absolute: 0.2 10*3/uL (ref 0.0–0.5)
Eosinophils Relative: 3 %
Immature Granulocytes: 0 %
Lymphocytes Relative: 32 %
Lymphs Abs: 2.7 10*3/uL (ref 0.7–4.0)
Monocytes Absolute: 0.6 10*3/uL (ref 0.1–1.0)
Monocytes Relative: 8 %
Neutro Abs: 4.7 10*3/uL (ref 1.7–7.7)
Neutrophils Relative %: 56 %

## 2022-01-25 LAB — RESP PANEL BY RT-PCR (FLU A&B, COVID) ARPGX2
Influenza A by PCR: NEGATIVE
Influenza B by PCR: NEGATIVE
SARS Coronavirus 2 by RT PCR: NEGATIVE

## 2022-01-25 LAB — URINALYSIS, ROUTINE W REFLEX MICROSCOPIC
Bilirubin Urine: NEGATIVE
Glucose, UA: NEGATIVE mg/dL
Hgb urine dipstick: NEGATIVE
Ketones, ur: NEGATIVE mg/dL
Leukocytes,Ua: NEGATIVE
Nitrite: NEGATIVE
Protein, ur: NEGATIVE mg/dL
Specific Gravity, Urine: 1.017 (ref 1.005–1.030)
pH: 5 (ref 5.0–8.0)

## 2022-01-25 LAB — APTT: aPTT: 29 seconds (ref 24–36)

## 2022-01-25 LAB — PROTIME-INR
INR: 1 (ref 0.8–1.2)
Prothrombin Time: 13.2 seconds (ref 11.4–15.2)

## 2022-01-25 NOTE — ED Triage Notes (Signed)
Pt reports he woke up with dizziness this morning. Pt reports he almost fell going to the bathroom. Pt also c/o right ankle swelling for 3 months.

## 2022-01-25 NOTE — ED Notes (Signed)
ED Provider at bedside. 

## 2022-01-25 NOTE — ED Notes (Addendum)
Tele Neuro consult in progress.  

## 2022-01-25 NOTE — Consult Note (Addendum)
[facility] TeleNeurology Consult Services  Stat Consult  Patient Name:   Ian Roberson, Ian Roberson Date of Birth:   05-Feb-1951 Identification Number:   MRN - [id number] Date of Service:   01/25/2022 22:12:45  Diagnosis:       R26.89 - Imbalance  Impression Dx: Unsteady gait. Etiology is highly suspicious for a stroke. HCT showed Lt pontine stroke, favor acute. Patient is not a candidate for IV thrombolytics, outside the window. Low suspicion for LVO, not a candidate for MER. Recommend admission for stroke workup as below. Start dual antiplatelet for 21 days followed by ASA monotherapy. Lipitor 80 mg. EKG. HBA1C.   Recommendations: Our recommendations are outlined below.  Diagnostic Studies : MRI Head without contrast Please order CTA head and neck with contrast Please order TTE  Antithrombotic Medications : Initiate dual antiplatelet therapy with Aspirin 81 mg daily and Clopidogrel 75 mg daily Please order Permissive hypertension, Antihypertensives with prn for first 24-48 hrs post stroke onset. If BP greater than 220/120 give Labetalol IV or Vasotec IV Please order Statins for LDL goal less than 70 Please order  Nursing Recommendations : Telemetry, IV Fluids, avoid dextrose containing fluids, Maintain euglycemiaNeuro checks q4 hrs x 24 hrs and Ian Roberson per shift  Consultations : Recommend Speech therapy if failed dysphagia screenPhysical therapy/Occupational therapy  DVT Prophylaxis : Choice of Primary Team  Disposition : Neurology will follow  ----------------------------------------------------------------------------------------------------    Metrics: [facility] Notification Time: 01/25/2022 22:10:37 Stamp Time: 01/25/2022 22:12:45 Callback Response Time: 01/25/2022 22:13:07   CT HEAD: Reviewed Lt pontine stroke, favor acute    ----------------------------------------------------------------------------------------------------  Chief  Complaint: unsteady gait  History of Present Illness: Patient is a 71 year old Male. 71 years old man with PMHx of HTN, HLD, DM, b/l leg edema and Gout presenting for evaluation of acute onset unsteady gait. Last known well time is 2300 on 5/20. At 5:30 AM, upon awakening, patient felt unsteady and kept leaning towards the right side. The imbalance remained throughout the day. He came to the emergency room due to persistent imbalance and leaning towards the right side. He denies symptoms like this in the past. Within the last two months had been experiencing progressive bilateral leg swelling, unclear etiology. Denied history of stroke. Patient is not on antiplatelet or anticoagulation therapy. Patient denies history of headaches, shortness of breath, chest pain, palpitations, or any focal neurological deficits.   Past Medical History:      There is no history of Stroke      There is no history of Seizures  Medications:  No Anticoagulant use  No Antiplatelet use Reviewed EMR for current medications  Allergies:   Description: PNC  Social History: Smoking: No Alcohol Use: No Drug Use: No  Family History:  There Is Family History Of: Father- dies does of MI. There is no family history of premature cerebrovascular disease pertinent to this consultation  ROS : 14 Points Review of Systems was performed and was negative except mentioned in HPI.  Past Surgical History: There Is No Surgical History Contributory To Today's Visit   Examination: BP(149/89), Pulse(75), 1A: Level of Consciousness - Alert; keenly responsive + 0 1B: Ask Month and Age - Both Questions Right + 0 1C: Blink Eyes & Squeeze Hands - Performs Both Tasks + 0 2: Test Horizontal Extraocular Movements - Normal + 0 3: Test Visual Fields - No Visual Loss + 0 4: Test Facial Palsy (Use Grimace if Obtunded) - Normal symmetry + 0 5A: Test Left Arm Motor Drift -  No Drift for 10 Seconds + 0 5B: Test Right Arm Motor Drift  - No Drift for 10 Seconds + 0 6A: Test Left Leg Motor Drift - No Drift for 5 Seconds + 0 6B: Test Right Leg Motor Drift - No Drift for 5 Seconds + 0 7: Test Limb Ataxia (FNF/Heel-Shin) - No Ataxia + 0 8: Test Sensation - Normal; No sensory loss + 0 9: Test Language/Aphasia - Normal; No aphasia + 0 10: Test Dysarthria - Normal + 0 11: Test Extinction/Inattention - No abnormality + 0  NIHSS Score: 0     Patient / Family was informed the Neurology Consult would occur via TeleHealth consult by way of interactive audio and video telecommunications and consented to receiving care in this manner.  Patient is being evaluated for possible acute neurologic impairment and high probability of imminent or life - threatening deterioration.I spent total of 35 minutes providing care to this patient, including time for face to face visit via telemedicine, review of medical records, imaging studies and discussion of findings with providers, the patient and / or family.   Dr Rica Records Witten Certain   [facility] [phone]  Case [id number]

## 2022-01-25 NOTE — ED Notes (Signed)
TeleNeurology camera at patient bedside.

## 2022-01-25 NOTE — ED Provider Notes (Addendum)
[facility] EMERGENCY DEPARTMENT Provider Note   CSN: [id number] Arrival date & time: 01/25/22  1925     History  Chief Complaint  Patient presents with   Dizziness    Ian Roberson is a 71 y.o. male.  Patient went to bed at 2300 last evening got up to use the bathroom at 5:30 in the morning and noticed that he had a lot of difficulty walking and felt discoordinated felt dizzy but no true room spinning.  That has persisted throughout the day has not resolved.  There was no visual changes no speech changes no weakness to the upper extremities or lower extremities no numbness.  Patient has not had a history of a stroke in the past.  Patient does have a history of hypertension and recently his blood pressure medicine was reduced.  Patient not on any blood thinners.  Past medical history sniffing for hypertension hyperlipidemia diabetes without complications.  Patient does not appear to be on any diabetic medicine.      Home Medications Prior to Admission medications   Medication Sig Start Date End Date Taking? Authorizing Provider  albuterol (VENTOLIN HFA) 108 (90 Base) MCG/ACT inhaler INHALE 2 PUFFS BY MOUTH FOUR TIMES A DAY 12/11/20   [provider]  allopurinol (ZYLOPRIM) 100 MG tablet Take 100 mg by mouth daily.    [provider]  amLODipine (NORVASC) 10 MG tablet Take 10 mg by mouth daily.    [provider]  azelastine (ASTELIN) 0.1 % nasal spray Place 1 spray into both nostrils 2 (two) times daily. Use in each nostril as directed 01/09/21   Wurst, [place], PA-C  doxycycline (VIBRAMYCIN) 100 MG capsule Take 1 capsule (100 mg total) by mouth 2 (two) times daily. 11/09/21   Triplett, Tammy, PA-C  fluticasone (FLONASE) 50 MCG/ACT nasal spray Place 1 spray into both nostrils daily. 10/28/21   Horton, Mayer Masker, MD  potassium chloride (MICRO-K) 10 MEQ CR capsule Take 1 capsule by mouth daily. 10/02/21   [provider]  sodium chloride (OCEAN) 0.65 %  SOLN nasal spray Place 1 spray into both nostrils as needed for congestion. 10/28/21   Horton, Mayer Masker, MD  traMADol (ULTRAM) 50 MG tablet TAKE ONE TABLET BY MOUTH FOUR TIMES A DAY FOR PAIN; REPLACES HYDROCODONE 06/17/21   [provider]      Allergies    Penicillins    Review of Systems   Review of Systems  Constitutional:  Negative for chills and fever.  HENT:  Negative for ear pain and sore throat.   Eyes:  Negative for pain and visual disturbance.  Respiratory:  Negative for cough and shortness of breath.   Cardiovascular:  Negative for chest pain and palpitations.  Gastrointestinal:  Negative for abdominal pain and vomiting.  Genitourinary:  Negative for dysuria and hematuria.  Musculoskeletal:  Negative for arthralgias and back pain.  Skin:  Negative for color change and rash.  Neurological:  Positive for dizziness, tremors and light-headedness. Negative for seizures, syncope, facial asymmetry, speech difficulty, weakness, numbness and headaches.  Hematological:  Does not bruise/bleed easily.  Psychiatric/Behavioral:  Negative for confusion.   All other systems reviewed and are negative.  Physical Exam Updated Vital Signs BP (!) 149/89   Pulse 87   Temp 98.7 F (37.1 C) (Oral)   Resp 15   Ht 1.88 m (6\' 2" )   Wt 102.5 kg   SpO2 99%   BMI 29.02 kg/m  Physical Exam Vitals and nursing note reviewed.  Constitutional:      General: He is not in acute distress.    Appearance: Normal appearance. He is well-developed.  HENT:     Head: Normocephalic and atraumatic.  Eyes:     Extraocular Movements: Extraocular movements intact.     Conjunctiva/sclera: Conjunctivae normal.     Pupils: Pupils are equal, round, and reactive to light.  Cardiovascular:     Rate and Rhythm: Normal rate and regular rhythm.     Heart sounds: No murmur heard. Pulmonary:     Effort: Pulmonary effort is normal. No respiratory distress.     Breath sounds: Normal breath sounds.   Abdominal:     Palpations: Abdomen is soft.     Tenderness: There is no abdominal tenderness.  Musculoskeletal:        General: Swelling present.     Cervical back: Normal range of motion and neck supple. No rigidity or tenderness.     Right lower leg: Edema present.     Left lower leg: Edema present.     Comments: Bilateral lower extremity swelling.  Some bilateral heaviness with lifting of both lower extremities.  But that is old.  It is mostly due to knee pain.  Skin:    General: Skin is warm and dry.     Capillary Refill: Capillary refill takes less than 2 seconds.  Neurological:     Mental Status: He is alert and oriented to person, place, and time.     Cranial Nerves: No cranial nerve deficit.     Sensory: No sensory deficit.     Motor: No weakness.     Gait: Gait abnormal.  Psychiatric:        Mood and Affect: Mood normal.    ED Results / Procedures / Treatments   Labs (all labs ordered are listed, but only abnormal results are displayed) Labs Reviewed  BASIC METABOLIC PANEL - Abnormal; Notable for the following components:      Result Value   Glucose, Bld 107 (*)    All other components within normal limits  RESP PANEL BY RT-PCR (FLU A&B, COVID) ARPGX2  CBC  URINALYSIS, ROUTINE W REFLEX MICROSCOPIC  DIFFERENTIAL  ETHANOL  PROTIME-INR  APTT  RAPID URINE DRUG SCREEN, HOSP PERFORMED  HEPATIC FUNCTION PANEL  CBG MONITORING, ED  I-STAT CHEM 8, ED    EKG EKG Interpretation  Date/Time:  Sunday Jan 25 2022 19:43:43 EDT Ventricular Rate:  94 PR Interval:  178 QRS Duration: 114 QT Interval:  364 QTC Calculation: 455 R Axis:   -59 Text Interpretation: Normal sinus rhythm Left anterior fascicular block Possible Anterior infarct , age undetermined Abnormal ECG When compared with ECG of 05-Dec-2021 22:24, PREVIOUS ECG IS PRESENT No significant change since last tracing Confirmed by Vanetta MuldersZackowski, Char Feltman [phone]) on 01/25/2022 9:32:13 PM  Radiology No results  found.  Procedures Procedures    Medications Ordered in ED Medications - No data to display  ED Course/ Medical Decision Making/ A&P                           Medical Decision Making Amount and/or Complexity of Data Reviewed Labs: ordered. Radiology: ordered.  Risk Decision regarding hospitalization.  CRITICAL CARE Performed by: Vanetta MuldersScott Keyandre Pileggi Total critical care time: 45 minutes Critical care time was exclusive of separately billable procedures and treating other patients. Critical care was necessary to treat or prevent imminent or life-threatening deterioration. Critical care was time spent personally by me on  the following activities: development of treatment plan with patient and/or surrogate as well as nursing, discussions with consultants, evaluation of patient's response to treatment, examination of patient, obtaining history from patient or surrogate, ordering and performing treatments and interventions, ordering and review of laboratory studies, ordering and review of radiographic studies, pulse oximetry and re-evaluation of patient's condition.  Concern would be for CVA as the cause of the difficulty with walking.  Patient without any cranial nerve deficits.  Upper extremity strength is good lower extremity strength little weak but bilaterally symmetrical does have bilateral lower extremity swelling.  Patient states that that weakness in the legs has been present for several weeks to months.  And is not new.  Unable to elicit any vertigo with moving his head.  Patient completely asymptomatic in bed.  But if he gets up the walking is abnormal and he feels as if his coordination is not working right is as it and as if his balance is off.  Will get head CT.  Have activated stroke order set.  We will have teleneurology consult on him.  Patient not in the window for TNK.  Probably also not a candidate for vascular intervention because its been almost 24 hours since the last  time he was normal.  Thinking that he may very well require MRI which would be available tonight but could be available in the morning and perhaps patient could be admitted for MRI in the morning and work-up by medicine as appropriate.  Teleneurology note pending.  Discussed with radiologist.  Age-indeterminate infarct within the left pons correlation with MRI would be helpful and clinical correlation.  Based on this we will see with teleneurology says but may be prudent to have patient admitted and get MRI in the morning.  Teleneurologist agrees that this most likely is an acute infarct.  And is recommending admission and MRI in the morning CTA head and neck when they can get it no urgency.   Final Clinical Impression(s) / ED Diagnoses Final diagnoses:  Dizziness  Cerebrovascular accident (CVA), unspecified mechanism [facility])    Rx / DC Orders ED Discharge Orders     None         Vanetta Mulders, MD 01/25/22 2219    Vanetta Mulders, MD 01/25/22 2320    Vanetta Mulders, MD 01/25/22 [phone]

## 2022-01-25 NOTE — ED Notes (Signed)
Teleneuro assessment completed; pt now being transported to CT.

## 2022-01-26 ENCOUNTER — Observation Stay (HOSPITAL_BASED_OUTPATIENT_CLINIC_OR_DEPARTMENT_OTHER): Payer: No Typology Code available for payment source

## 2022-01-26 ENCOUNTER — Observation Stay (HOSPITAL_COMMUNITY): Payer: No Typology Code available for payment source

## 2022-01-26 ENCOUNTER — Emergency Department (HOSPITAL_COMMUNITY): Payer: No Typology Code available for payment source

## 2022-01-26 ENCOUNTER — Other Ambulatory Visit (HOSPITAL_COMMUNITY): Payer: Self-pay | Admitting: *Deleted

## 2022-01-26 DIAGNOSIS — M7989 Other specified soft tissue disorders: Secondary | ICD-10-CM

## 2022-01-26 DIAGNOSIS — E119 Type 2 diabetes mellitus without complications: Secondary | ICD-10-CM

## 2022-01-26 DIAGNOSIS — G250 Essential tremor: Secondary | ICD-10-CM | POA: Diagnosis not present

## 2022-01-26 DIAGNOSIS — I6389 Other cerebral infarction: Secondary | ICD-10-CM

## 2022-01-26 DIAGNOSIS — R2681 Unsteadiness on feet: Secondary | ICD-10-CM | POA: Diagnosis not present

## 2022-01-26 DIAGNOSIS — M1000 Idiopathic gout, unspecified site: Secondary | ICD-10-CM | POA: Diagnosis not present

## 2022-01-26 DIAGNOSIS — I10 Essential (primary) hypertension: Secondary | ICD-10-CM | POA: Diagnosis not present

## 2022-01-26 DIAGNOSIS — I639 Cerebral infarction, unspecified: Secondary | ICD-10-CM | POA: Insufficient documentation

## 2022-01-26 DIAGNOSIS — M109 Gout, unspecified: Secondary | ICD-10-CM

## 2022-01-26 LAB — HEPATIC FUNCTION PANEL
ALT: 11 U/L (ref 0–44)
AST: 19 U/L (ref 15–41)
Albumin: 3.9 g/dL (ref 3.5–5.0)
Alkaline Phosphatase: 62 U/L (ref 38–126)
Bilirubin, Direct: 0.1 mg/dL (ref 0.0–0.2)
Indirect Bilirubin: 0.5 mg/dL (ref 0.3–0.9)
Total Bilirubin: 0.6 mg/dL (ref 0.3–1.2)
Total Protein: 6.8 g/dL (ref 6.5–8.1)

## 2022-01-26 LAB — ECHOCARDIOGRAM COMPLETE
AR max vel: 2.22 cm2
AV Area VTI: 2.11 cm2
AV Area mean vel: 1.99 cm2
AV Mean grad: 5 mmHg
AV Peak grad: 9 mmHg
Ao pk vel: 1.5 m/s
Area-P 1/2: 4.01 cm2
Calc EF: 49.8 %
Height: 74 in
MV VTI: 2.71 cm2
S' Lateral: 4.1 cm
Single Plane A2C EF: 51.2 %
Single Plane A4C EF: 52.5 %
Weight: 3616 oz

## 2022-01-26 LAB — HIV ANTIBODY (ROUTINE TESTING W REFLEX): HIV Screen 4th Generation wRfx: NONREACTIVE

## 2022-01-26 LAB — I-STAT CHEM 8, ED
BUN: 10 mg/dL (ref 8–23)
Calcium, Ion: 1.3 mmol/L (ref 1.15–1.40)
Chloride: 104 mmol/L (ref 98–111)
Creatinine, Ser: 0.9 mg/dL (ref 0.61–1.24)
Glucose, Bld: 93 mg/dL (ref 70–99)
HCT: 43 % (ref 39.0–52.0)
Hemoglobin: 14.6 g/dL (ref 13.0–17.0)
Potassium: 4.1 mmol/L (ref 3.5–5.1)
Sodium: 140 mmol/L (ref 135–145)
TCO2: 26 mmol/L (ref 22–32)

## 2022-01-26 LAB — COMPREHENSIVE METABOLIC PANEL
ALT: 12 U/L (ref 0–44)
AST: 17 U/L (ref 15–41)
Albumin: 3.6 g/dL (ref 3.5–5.0)
Alkaline Phosphatase: 55 U/L (ref 38–126)
Anion gap: 5 (ref 5–15)
BUN: 10 mg/dL (ref 8–23)
CO2: 23 mmol/L (ref 22–32)
Calcium: 9.1 mg/dL (ref 8.9–10.3)
Chloride: 110 mmol/L (ref 98–111)
Creatinine, Ser: 0.81 mg/dL (ref 0.61–1.24)
GFR, Estimated: >60 mL/min (ref >60)
Glucose, Bld: 101 mg/dL — ABNORMAL HIGH (ref 70–99)
Potassium: 3.3 mmol/L — ABNORMAL LOW (ref 3.5–5.1)
Sodium: 138 mmol/L (ref 135–145)
Total Bilirubin: 0.8 mg/dL (ref 0.3–1.2)
Total Protein: 6.1 g/dL — ABNORMAL LOW (ref 6.5–8.1)

## 2022-01-26 LAB — LIPID PANEL
Cholesterol: 154 mg/dL (ref 0–200)
HDL: 64 mg/dL (ref >40)
LDL Cholesterol: 82 mg/dL (ref 0–99)
Total CHOL/HDL Ratio: 2.4 RATIO
Triglycerides: 41 mg/dL (ref <150)
VLDL: 8 mg/dL (ref 0–40)

## 2022-01-26 LAB — CBG MONITORING, ED
Glucose-Capillary: 105 mg/dL — ABNORMAL HIGH (ref 70–99)
Glucose-Capillary: 105 mg/dL — ABNORMAL HIGH (ref 70–99)
Glucose-Capillary: 105 mg/dL — ABNORMAL HIGH (ref 70–99)
Glucose-Capillary: 95 mg/dL (ref 70–99)

## 2022-01-26 LAB — RAPID URINE DRUG SCREEN, HOSP PERFORMED
Amphetamines: NOT DETECTED
Barbiturates: NOT DETECTED
Benzodiazepines: NOT DETECTED
Cocaine: NOT DETECTED
Opiates: NOT DETECTED
Tetrahydrocannabinol: NOT DETECTED

## 2022-01-26 LAB — CBC
HCT: 36.5 % — ABNORMAL LOW (ref 39.0–52.0)
Hemoglobin: 12.4 g/dL — ABNORMAL LOW (ref 13.0–17.0)
MCH: 28.4 pg (ref 26.0–34.0)
MCHC: 34 g/dL (ref 30.0–36.0)
MCV: 83.5 fL (ref 80.0–100.0)
Platelets: 189 10*3/uL (ref 150–400)
RBC: 4.37 MIL/uL (ref 4.22–5.81)
RDW: 14.3 % (ref 11.5–15.5)
WBC: 8 10*3/uL (ref 4.0–10.5)
nRBC: 0 % (ref 0.0–0.2)

## 2022-01-26 LAB — HEMOGLOBIN A1C
Hgb A1c MFr Bld: 5.8 % — ABNORMAL HIGH (ref 4.8–5.6)
Mean Plasma Glucose: 119.76 mg/dL

## 2022-01-26 LAB — PHOSPHORUS: Phosphorus: 3.5 mg/dL (ref 2.5–4.6)

## 2022-01-26 LAB — MAGNESIUM: Magnesium: 1.6 mg/dL — ABNORMAL LOW (ref 1.7–2.4)

## 2022-01-26 MED ORDER — ATORVASTATIN CALCIUM 40 MG PO TABS
40.0000 mg | ORAL_TABLET | Freq: Every day | ORAL | Status: DC
  Administered 2022-01-26: 40 mg via ORAL
  Filled 2022-01-26: qty 1

## 2022-01-26 MED ORDER — ATORVASTATIN CALCIUM 40 MG PO TABS
80.0000 mg | ORAL_TABLET | Freq: Every day | ORAL | Status: DC
Start: 2022-01-26 — End: 2022-01-26

## 2022-01-26 MED ORDER — ALLOPURINOL 100 MG PO TABS: 100.0000 mg | ORAL_TABLET | Freq: Every day | ORAL | Status: DC

## 2022-01-26 MED ORDER — IOHEXOL 350 MG/ML SOLN
100.0000 mL | Freq: Once | INTRAVENOUS | Status: AC | PRN
  Administered 2022-01-26: 100 mL via INTRAVENOUS

## 2022-01-26 MED ORDER — ASPIRIN 81 MG PO TBEC: 81.0000 mg | DELAYED_RELEASE_TABLET | Freq: Every day | ORAL | 12 refills | Status: AC

## 2022-01-26 MED ORDER — ASPIRIN 81 MG PO TBEC
81.0000 mg | DELAYED_RELEASE_TABLET | Freq: Every day | ORAL | Status: DC
  Administered 2022-01-26: 81 mg via ORAL
  Filled 2022-01-26: qty 1

## 2022-01-26 MED ORDER — POTASSIUM CHLORIDE CRYS ER 20 MEQ PO TBCR
40.0000 meq | EXTENDED_RELEASE_TABLET | Freq: Once | ORAL | Status: AC
Start: 2022-01-26 — End: 2022-01-26
  Administered 2022-01-26: 40 meq via ORAL
  Filled 2022-01-26: qty 2

## 2022-01-26 MED ORDER — CLOPIDOGREL BISULFATE 75 MG PO TABS
75.0000 mg | ORAL_TABLET | Freq: Every day | ORAL | Status: DC
  Administered 2022-01-26: 75 mg via ORAL
  Filled 2022-01-26: qty 1

## 2022-01-26 MED ORDER — ATORVASTATIN CALCIUM 40 MG PO TABS: 40.0000 mg | ORAL_TABLET | Freq: Every day | ORAL | 1 refills | Status: AC

## 2022-01-26 MED ORDER — INSULIN ASPART 100 UNIT/ML IJ SOLN: 0.0000 [IU] | INTRAMUSCULAR | Status: DC

## 2022-01-26 MED ORDER — ACETAMINOPHEN 325 MG PO TABS
650.0000 mg | ORAL_TABLET | Freq: Four times a day (QID) | ORAL | Status: DC | PRN
Start: 2022-01-26 — End: 2022-01-26
  Administered 2022-01-26: 650 mg via ORAL
  Filled 2022-01-26: qty 2

## 2022-01-26 MED ORDER — MAGNESIUM SULFATE 2 GM/50ML IV SOLN
2.0000 g | Freq: Once | INTRAVENOUS | Status: AC
  Administered 2022-01-26: 2 g via INTRAVENOUS
  Filled 2022-01-26: qty 50

## 2022-01-26 NOTE — Plan of Care (Cosign Needed)
  Problem: Acute Rehab PT Goals(only PT should resolve) Goal: Pt Will Go Supine/Side To Sit Outcome: Progressing Flowsheets (Taken 01/26/2022 1159) Pt will go Supine/Side to Sit: Independently Goal: Patient Will Transfer Sit To/From Stand Outcome: Progressing Flowsheets (Taken 01/26/2022 1159) Patient will transfer sit to/from stand:  Independently  with modified independence Goal: Pt Will Transfer Bed To Chair/Chair To Bed Outcome: Progressing Flowsheets (Taken 01/26/2022 1159) Pt will Transfer Bed to Chair/Chair to Bed:  Independently  with modified independence Goal: Pt Will Perform Standing Balance Or Pre-Gait Outcome: Progressing Flowsheets (Taken 01/26/2022 1159) Pt will perform standing balance or pre-gait:  with no UE support  with Supervision  with Modified Independent Goal: Pt Will Ambulate Outcome: Progressing Flowsheets (Taken 01/26/2022 1159) Pt will Ambulate:  > 125 feet  Independently  with modified independence Goal: Pt Will Go Up/Down Stairs Outcome: Progressing Flowsheets (Taken 01/26/2022 1159) Pt will Go Up / Down Stairs:  3-5 stairs  with modified independence  with supervision   12:00 PM, 01/26/22 Arther Abbott, S/PT

## 2022-01-26 NOTE — Evaluation (Signed)
Physical Therapy Evaluation Patient Details Name: Ian Roberson MRN: [id number] DOB: 1951/06/13 Today's Date: 01/26/2022  History of Present Illness  Ian Roberson is a 71 y.o. male with medical history significant of hypertension, T2DM and gout who presents to the emergency department due to 1 day onset of ambulatory difficulty and dizziness.  Patient states that he went to bed 2 nights ago around 11 PM and woke up yesterday in the morning around 5:30 AM to use the bathroom and he noted that he was having difficulty in walking whereby his gait deviated towards the right and on trying to correct this, he felt imbalanced as if he was going to fall. Symptoms persist throughout the day and he decided to go to the ED for further evaluation and management.  He denies speech or visual changes, he states that his metformin dose was recently reduced.   Clinical Impression  Patient demonstrated ability to perform bed mobility and sit to stand transfers independently with no assist. Patient was WNL with UE/LE myotome assessment with generalized weakness observed with B LE. Patient was Ian to ambulate for 100 feet with modified independence and supervision by PT. Patient did demonstrate minor gait deviations primarily involving the L LE with decreased L ankle dorsiflexion, decreased step length and decreased stance time on L LE. Patient also demonstrated minor foot slap of L LE along with continuing to drift right with ability to correct deficit. Patient does have difficulty with turning L with minor gait deviations and balance impacting patient's ability to do so. Patient will benefit from continued skilled physical therapy in hospital and recommended venue below to increase strength, balance, endurance for safe ADLs and gait.      Recommendations for follow up therapy are one component of a multi-disciplinary discharge planning process, led by the attending physician.  Recommendations may be updated based on  patient status, additional functional criteria and insurance authorization.  Follow Up Recommendations Outpatient PT    Assistance Recommended at Discharge    Patient can return home with the following  A little help with walking and/or transfers;Help with stairs or ramp for entrance    Equipment Recommendations None recommended by PT  Recommendations for Other Services       Functional Status Assessment Patient has had a recent decline in their functional status and demonstrates the ability to make significant improvements in function in a reasonable and predictable amount of time.     Precautions / Restrictions Precautions Precautions: Fall Restrictions Weight Bearing Restrictions: No      Mobility  Bed Mobility Overal bed mobility: Independent             General bed mobility comments: Patient is independent with supine to sit bed mobility.    Transfers Overall transfer level: Independent Equipment used: None               General transfer comment: Patient is independent with sit to stand transfer demonstrating appropriate amount of strength and coordination needed to complete task.    Ambulation/Gait Ambulation/Gait assistance: Modified independent (Device/Increase time), Supervision Gait Distance (Feet): 100 Feet Assistive device: None Gait Pattern/deviations: Step-to pattern, Decreased step length - left, Decreased step length - right, Decreased stance time - left, Decreased stride length, Decreased dorsiflexion - left, Drifts right/left Gait velocity: decreased     General Gait Details: Patient Ian to ambualate for 100 feet with modified independence and supervision assist. Patient demonstrates decreased L ankle dorsiflexion with mild L foot slap observed during gait asessment.  Patient does demonstrate mild balance deviations with current presentation and sx with still drifting to the right with ability to correct drifting deviation. Patient does  struggle with turning left with mild decrease in balance and extended time needed to perform turning.  Stairs            Wheelchair Mobility    Modified Rankin (Stroke Patients Only)       Balance Overall balance assessment: Needs assistance Sitting-balance support: Bilateral upper extremity supported, Feet supported Sitting balance-Leahy Scale: Good Sitting balance - Comments: Patient Ian to sit EOB independently with ability to support themselves.   Standing balance support: No upper extremity supported, During functional activity Standing balance-Leahy Scale: Fair Standing balance comment: Patient demonstrates fair standing balance with but does have minor deficits once reaching outside BOS.                             Pertinent Vitals/Pain Pain Assessment Pain Assessment: 0-10 Pain Score: 3  Pain Location: Posterior aspect of head Pain Descriptors / Indicators: Constant Pain Intervention(s): Limited activity within patient's tolerance, Monitored during session    Home Living Family/patient expects to be discharged to:: Private residence Living Arrangements: Spouse/significant other Available Help at Discharge: Available 24 hours/day Type of Home: House Home Access: Stairs to enter Entrance Stairs-Rails: Left Entrance Stairs-Number of Steps: 2   Home Layout: One level Home Equipment: Cane - single point Additional Comments: Cane prn    Prior Function Prior Level of Function : Independent/Modified Independent             Mobility Comments: Patient reports being Ian to ambulate at home and in community independently. Patient uses cane prn but not all the time. Patient was previously driving. ADLs Comments: Patient reports being independent with ADL's and functional tasks previously.     Hand Dominance        Extremity/Trunk Assessment   Upper Extremity Assessment Upper Extremity Assessment: Overall WFL for tasks assessed    Lower  Extremity Assessment Lower Extremity Assessment: Generalized weakness;LLE deficits/detail LLE Deficits / Details: Patient WNL for most myotomes during assessment with L2 noted for 3/5. Patient also demonstrates decreased L ankle dorsiflexion along with coordination of movement.    Cervical / Trunk Assessment Cervical / Trunk Assessment: Normal  Communication   Communication: No difficulties  Cognition Arousal/Alertness: Awake/alert Behavior During Therapy: WFL for tasks assessed/performed Overall Cognitive Status: Within Functional Limits for tasks assessed                                          General Comments      Exercises     Assessment/Plan    PT Assessment Patient needs continued PT services;All further PT needs can be met in the next venue of care  PT Problem List Decreased strength;Decreased activity tolerance;Decreased range of motion;Decreased balance;Decreased mobility;Decreased coordination       PT Treatment Interventions DME instruction;Gait training;Functional mobility training;Therapeutic activities;Therapeutic exercise;Balance training    PT Goals (Current goals can be found in the Care Plan section)  Acute Rehab PT Goals Patient Stated Goal: return home PT Goal Formulation: With patient Time For Goal Achievement: 02/09/22 Potential to Achieve Goals: Good    Frequency Min 3X/week     Co-evaluation               AM-PAC PT "  6 Clicks" Mobility  Outcome Measure Help needed turning from your back to your side while in a flat bed without using bedrails?: None Help needed moving from lying on your back to sitting on the side of a flat bed without using bedrails?: None Help needed moving to and from a bed to a chair (including a wheelchair)?: None Help needed standing up from a chair using your arms (e.g., wheelchair or bedside chair)?: A Little Help needed to walk in hospital room?: A Little Help needed climbing 3-5 steps with a  railing? : A Little 6 Click Score: 21    End of Session   Activity Tolerance: Patient tolerated treatment well;Patient limited by fatigue Patient left: in bed;with call bell/phone within reach;with family/visitor present Nurse Communication: Mobility status PT Visit Diagnosis: Unsteadiness on feet (R26.81);Other abnormalities of gait and mobility (R26.89);Muscle weakness (generalized) (M62.81)    Time: 8316-7425 PT Time Calculation (min) (ACUTE ONLY): 23 min   Charges:   PT Evaluation $PT Eval Moderate Complexity: 1 Mod PT Treatments $Therapeutic Activity: 23-37 mins        11:58 AM, 01/26/22 Lestine Box, S/PT

## 2022-01-26 NOTE — H&P (Signed)
History and Physical    Patient: Ian Roberson QIO:[id number] DOB: 11-13-1950 DOA: 01/25/2022 DOS: the patient was seen and examined on 01/26/2022 PCP: [facility]  Patient coming from: Home  Chief Complaint:  Chief Complaint  Patient presents with   Dizziness   HPI: Ian Roberson is a 71 y.o. male with medical history significant of hypertension, T2DM and gout who presents to the emergency department due to 1 day onset of ambulatory difficulty and dizziness.  Patient states that he went to bed 2 nights ago around 11 PM and woke up yesterday in the morning around 5:30 AM to use the bathroom and he noted that he was having difficulty in walking whereby his gait deviated towards the right and on trying to correct this, he felt imbalanced as if he was going to fall. Symptoms persist throughout the day and he decided to go to the ED for further evaluation and management.  He denies speech or visual changes, he states that his metformin dose was recently reduced.  ED Course:  In the emergency department, he was intermittently tachypneic and initially tachycardic.  BP 162/92 temperature 98.29F and O2 sat 97-100% on room air.  Work-up in the ED showed normal CBC and BMP, alcohol level was < 10, urinalysis was normal.  Influenza A, B, SARS coronavirus 2 was negative. CT head without contrast showed age indeterminate infarct within the left pons. Neurologist was consulted, stroke was suspected and further stroke work-up in the morning was recommended.  Hospitalist was asked to admit patient for further evaluation and management.  Review of Systems: Review of systems as noted in the HPI. All other systems reviewed and are negative.   Past Medical History:  Diagnosis Date   Diabetes mellitus without complication (HCC)    Gout    Hyperlipidemia    Hypertension    Past Surgical History:  Procedure Laterality Date   FOOT SURGERY     KNEE SURGERY     NECK SURGERY      Social History:   reports that he has quit smoking. His smoking use included cigarettes. He has never used smokeless tobacco. He reports current alcohol use. He reports that he does not use drugs.   Allergies  Allergen Reactions   Penicillins Other (See Comments)    Convulsions Has patient had a PCN reaction causing immediate rash, facial/tongue/throat swelling, SOB or lightheadedness with hypotension: No Has patient had a PCN reaction causing severe rash involving mucus membranes or skin necrosis: No Has patient had a PCN reaction that required hospitalization No Has patient had a PCN reaction occurring within the last 10 years: No If all of the above answers are "NO", then may proceed with Cephalosporin use.     Family History  Problem Relation Age of Onset   Heart attack Father    Stroke Mother      Prior to Admission medications   Medication Sig Start Date End Date Taking? Authorizing Provider  albuterol (VENTOLIN HFA) 108 (90 Base) MCG/ACT inhaler INHALE 2 PUFFS BY MOUTH FOUR TIMES A DAY 12/11/20   [provider]  allopurinol (ZYLOPRIM) 100 MG tablet Take 100 mg by mouth daily.    [provider]  amLODipine (NORVASC) 10 MG tablet Take 10 mg by mouth daily.    [provider]  azelastine (ASTELIN) 0.1 % nasal spray Place 1 spray into both nostrils 2 (two) times daily. Use in each nostril as directed 01/09/21   Wurst, [place], PA-C  doxycycline (VIBRAMYCIN) 100  MG capsule Take 1 capsule (100 mg total) by mouth 2 (two) times daily. 11/09/21   Triplett, Tammy, PA-C  fluticasone (FLONASE) 50 MCG/ACT nasal spray Place 1 spray into both nostrils daily. 10/28/21   Horton, Mayer Maskerourtney F, MD  potassium chloride (MICRO-K) 10 MEQ CR capsule Take 1 capsule by mouth daily. 10/02/21   [provider]  sodium chloride (OCEAN) 0.65 % SOLN nasal spray Place 1 spray into both nostrils as needed for congestion. 10/28/21   Horton, Mayer Maskerourtney F, MD  traMADol (ULTRAM) 50 MG tablet TAKE ONE TABLET  BY MOUTH FOUR TIMES A DAY FOR PAIN; REPLACES HYDROCODONE 06/17/21   [provider]    Physical Exam: BP (!) 163/83   Pulse 89   Temp 98.7 F (37.1 C) (Oral)   Resp 17   Ht 6\' 2"  (1.88 m)   Wt 102.5 kg   SpO2 100%   BMI 29.02 kg/m   General: 71 y.o. year-old male well developed well nourished in no acute distress.  Alert and oriented x3. HEENT: NCAT, EOMI Neck: Supple, trachea medial Cardiovascular: Regular rate and rhythm with no rubs or gallops.  No thyromegaly or JVD noted.  No lower extremity edema. 2/4 pulses in all 4 extremities. Respiratory: Clear to auscultation with no wheezes or rales. Good inspiratory effort. Abdomen: Soft, nontender nondistended with normal bowel sounds x4 quadrants. Muskuloskeletal: Right lower extremity swelling.  No cyanosis, clubbing or edema noted bilaterally Neuro: Tremors, CN II-XII intact, sensation, reflexes intact Skin: No ulcerative lesions noted or rashes Psychiatry: Judgement and insight appear normal. Mood is appropriate for condition and setting          Labs on Admission:  Basic Metabolic Panel: Recent Labs  Lab 01/25/22 2013 01/26/22 0002  NA 139 140  K 4.0 4.1  CL 108 104  CO2 24  --   GLUCOSE 107* 93  BUN 11 10  CREATININE 0.85 0.90  CALCIUM 9.8  --    Liver Function Tests: Recent Labs  Lab 01/25/22 2312  AST 19  ALT 11  ALKPHOS 62  BILITOT 0.6  PROT 6.8  ALBUMIN 3.9   No results for input(s): LIPASE, AMYLASE in the last 168 hours. No results for input(s): AMMONIA in the last 168 hours. CBC: Recent Labs  Lab 01/25/22 2013 01/26/22 0002  WBC 8.6  --   NEUTROABS 4.7  --   HGB 13.6 14.6  HCT 41.4 43.0  MCV 85.5  --   PLT 197  --    Cardiac Enzymes: No results for input(s): CKTOTAL, CKMB, CKMBINDEX, TROPONINI in the last 168 hours.  BNP (last 3 results) No results for input(s): BNP in the last 8760 hours.  ProBNP (last 3 results) No results for input(s): PROBNP in the last 8760  hours.  CBG: Recent Labs  Lab 01/26/22 0011  GLUCAP 95    Radiological Exams on Admission: CT ANGIO HEAD NECK W WO CM  Result Date: 01/26/2022 CLINICAL DATA:  Unsteady, leaning towards the right side EXAM: CT ANGIOGRAPHY HEAD AND NECK TECHNIQUE: Multidetector CT imaging of the head and neck was performed using the standard protocol during bolus administration of intravenous contrast. Multiplanar CT image reconstructions and MIPs were obtained to evaluate the vascular anatomy. Carotid stenosis measurements (when applicable) are obtained utilizing NASCET criteria, using the distal internal carotid diameter as the denominator. RADIATION DOSE REDUCTION: This exam was performed according to the departmental dose-optimization program which includes automated exposure control, adjustment of the mA and/or kV according to patient  size and/or use of iterative reconstruction technique. CONTRAST:  OMNIPAQUE IOHEXOL 350 MG/ML SOLN COMPARISON:  No prior CTA, correlation is made with 01/25/2022 CT head FINDINGS: CT HEAD FINDINGS For noncontrast findings, please see same day CT head. CTA NECK FINDINGS Aortic arch: Two-vessel arch with a common origin of the brachiocephalic and left common carotid arteries. Imaged portion shows no evidence of aneurysm or dissection. No significant stenosis of the major arch vessel origins. Right carotid system: No evidence of dissection, occlusion, or hemodynamically significant stenosis (greater than 50%). Left carotid system: No evidence of dissection, occlusion, or hemodynamically significant stenosis (greater than 50%). Vertebral arteries: No evidence of dissection, occlusion, or hemodynamically significant stenosis (greater than 50%). Skeleton: Degenerative changes in the cervical spine. No acute osseous abnormality. Edentulous. Other neck: No acute finding. Upper chest: No focal pulmonary opacity or pleural effusion. Review of the MIP images confirms the above findings CTA  HEAD FINDINGS Evaluation is somewhat limited by bolus timing and degree of venous contamination. Anterior circulation: Both internal carotid arteries are patent to the termini, with calcifications in the distal cavernous segments but without significant stenosis. A1 segments patent. Normal anterior communicating artery. Anterior cerebral arteries are patent to their distal aspects. No M1 stenosis or occlusion. Normal MCA bifurcations. Distal MCA branches are somewhat irregular but otherwise perfused and symmetric. Posterior circulation: Vertebral arteries patent to the vertebrobasilar junction without stenosis. Posterior inferior cerebral arteries patent bilaterally. Basilar patent to its distal aspect. Superior cerebellar arteries patent bilaterally. Patent P1 segments, although the left P1 is quite diminutive. Patent left posterior communicating artery, with near fetal origin of the left PCA. PCAs perfused to their distal aspects without stenosis. Right posterior communicating artery is not visualized. Venous sinuses: As permitted by contrast timing, patent. Anatomic variants: Near fetal origin of the left PCA. Review of the MIP images confirms the above findings IMPRESSION: 1.  No intracranial large vessel occlusion or significant stenosis. 2.  No hemodynamically significant stenosis in the neck. Electronically Signed   By: Wiliam Ke M.D.   On: 01/26/2022 01:05   CT HEAD WO CONTRAST  Result Date: 01/25/2022 CLINICAL DATA:  Neuro deficit, acute, stroke suspected. Gait abnormality. Dizziness. EXAM: CT HEAD WITHOUT CONTRAST TECHNIQUE: Contiguous axial images were obtained from the base of the skull through the vertex without intravenous contrast. RADIATION DOSE REDUCTION: This exam was performed according to the departmental dose-optimization program which includes automated exposure control, adjustment of the mA and/or kV according to patient size and/or use of iterative reconstruction technique.  COMPARISON:  None Available. FINDINGS: Brain: Normal anatomic configuration. Parenchymal volume loss is commensurate with the patient's age. Mild periventricular white matter changes are present likely reflecting the sequela of small vessel ischemia. Age indeterminate infarct involving the left pons extending toward the cerebellar peduncle, best seen on image # 7/2. No abnormal intra or extra-axial mass lesion or fluid collection. No abnormal mass effect or midline shift. No evidence of acute intracranial hemorrhage. Ventricular size is normal. Cerebellum unremarkable. Vascular: No asymmetric hyperdense vasculature at the skull base. Skull: Intact Sinuses/Orbits: Paranasal sinuses are clear. Orbits are unremarkable. Other: Mastoid air cells and middle ear cavities are clear. 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 Correlation with clinical examination is recommended. If indicated, this would be better assessed with MRI examination. Mild senescent change. Electronically Signed   By: Helyn Numbers M.D.   On: 01/25/2022 22:57    EKG: I independently viewed the EKG done and my findings are as followed: Normal  sinus rhythm at rate of 94 bpm with LAFB no significant change from last tracing on 12/05/2021)  Assessment/Plan Present on Admission:  Essential hypertension  Principal Problem:   Unsteady gait Active Problems:   Essential hypertension   Type 2 diabetes mellitus (HCC)   Gout   Essential tremor  Unsteady gait rule out acute ischemic stroke CT head without contrast showed age indeterminate infarct within the left pons. Unsteady gait rule out acute ischemic stroke Patient will be admitted to telemetry unit  CT angiography of head and neck showed no intracranial large vessel occlusion or significant stenosis. No hemodynamically significant stenosis in the neck. Echocardiogram in the morning MRI of brain without contrast in the morning Continue aspirin 81 mg and Plavix 75 mg  for 21 days, followed by aspirin monotherapy per teleneurologist recommendation Continue Lipitor 80 mg p.o. daily per teleneurologist recommendation Continue fall precautions and neuro checks Lipid panel and hemoglobin A1c will be checked Continue PT/OT eval and treat Bedside swallow eval by nursing prior to diet Consider SLP eval and treat if patient fails bedside swallow eval Tele neurology will be consulted in the morning and we shall await further recommendations  Essential hypertension  Antihypertensives PRN if Blood pressure is greater than 220/120 or there is a concern for End organ damage/contraindications for permissive HTN. If blood pressure is greater than 220/120 give labetalol PO or IV or Vasotec IV with a goal of 15% reduction in BP during the first 24 hours.  Right lower extremity swelling Patient states this is a chronic problem RLE U/S done on 09/25/2021 showed no evidence of deep venous thrombosis in the right lower extremity Continue to monitor patient and decide on repeat ultrasound for worsening of symptoms  Type 2 diabetes mellitus Hemoglobin A1c pending  States that his metformin dose was recently reduced Continue ISS and hypoglycemic protocol  Essential tremor Stable per patient, he was on no medication for this  Gout Continue allopurinol   DVT prophylaxis: SCDs  Code Status: Full code  Consults: Teleneurology  Family Communication: Wife at bedside (all questions answered to satisfaction)  Severity of Illness: The appropriate patient status for this patient is OBSERVATION. Observation status is judged to be reasonable and necessary in order to provide the required intensity of service to ensure the patient's safety. The patient's presenting symptoms, physical exam findings, and initial radiographic and laboratory data in the context of their medical condition is felt to place them at decreased risk for further clinical deterioration. Furthermore, it is  anticipated that the patient will be medically stable for discharge from the hospital within 2 midnights of admission.   Author: Frankey Shown, DO 01/26/2022 1:32 AM  For on call review [email or web address].

## 2022-01-26 NOTE — ED Notes (Signed)
Patient has tremors in arms bilaterally. Pt states these tremors were present prior to yesterday, and that "they have gotten worse."

## 2022-01-26 NOTE — Progress Notes (Addendum)
TELESTROKE TEAM PROGRESS NOTE   SUBJECTIVE (INTERVAL HISTORY) His wife and RN are at the bedside.  Pt walked with PT today and continues to complain of woozy and swimmy feeling of his head, still leaning towards right on walking. However, his right leg is swollen and weaker than the left on exam.   Per pt, he has bilateral lower extremity swelling in 09/2021, at that time no DVT reported.  He stated that later on both leg swelling getting better, however right leg swelling came back later.  He had chest CTA ruled out PE in 12/2021.  Currently he still has right leg swollen.  Denies any arm or face numbness weakness.  Denies any neck pain or back pain.  No bowel bladder incontinence.   OBJECTIVE Temp:  [98.7 F (37.1 C)-98.9 F (37.2 C)] 98.7 F (37.1 C) (05/21 2050) Pulse Rate:  [76-104] 77 (05/22 1030) Cardiac Rhythm: Normal sinus rhythm (05/21 2051) Resp:  [13-29] 18 (05/22 1030) BP: (141-186)/(76-110) 153/84 (05/22 1030) SpO2:  [97 %-100 %] 100 % (05/22 1030) FiO2 (%):  [21 %] 21 % (05/22 0055) Weight:  [102.5 kg] 102.5 kg (05/21 2050)  Recent Labs  Lab 01/26/22 0011 01/26/22 0337 01/26/22 0829  GLUCAP 95 105* 105*   Recent Labs  Lab 01/25/22 2013 01/26/22 0002 01/26/22 0345  NA 139 140 138  K 4.0 4.1 3.3*  CL 108 104 110  CO2 24  --  23  GLUCOSE 107* 93 101*  BUN 11 10 10   CREATININE 0.85 0.90 0.81  CALCIUM 9.8  --  9.1  MG  --   --  1.6*  PHOS  --   --  3.5   Recent Labs  Lab 01/25/22 2312 01/26/22 0345  AST 19 17  ALT 11 12  ALKPHOS 62 55  BILITOT 0.6 0.8  PROT 6.8 6.1*  ALBUMIN 3.9 3.6   Recent Labs  Lab 01/25/22 2013 01/26/22 0002 01/26/22 0345  WBC 8.6  --  8.0  NEUTROABS 4.7  --   --   HGB 13.6 14.6 12.4*  HCT 41.4 43.0 36.5*  MCV 85.5  --  83.5  PLT 197  --  189   No results for input(s): CKTOTAL, CKMB, CKMBINDEX, TROPONINI in the last 168 hours. Recent Labs    01/25/22 2013  LABPROT 13.2  INR 1.0   Recent Labs    01/25/22 2128   COLORURINE YELLOW  LABSPEC 1.017  PHURINE 5.0  GLUCOSEU NEGATIVE  HGBUR NEGATIVE  BILIRUBINUR NEGATIVE  KETONESUR NEGATIVE  PROTEINUR NEGATIVE  NITRITE NEGATIVE  LEUKOCYTESUR NEGATIVE       Component Value Date/Time   CHOL 154 01/26/2022 0345   TRIG 41 01/26/2022 0345   HDL 64 01/26/2022 0345   CHOLHDL 2.4 01/26/2022 0345   VLDL 8 01/26/2022 0345   LDLCALC 82 01/26/2022 0345   Lab Results  Component Value Date   HGBA1C 5.8 (H) 01/26/2022      Component Value Date/Time   LABOPIA NONE DETECTED 01/25/2022 2128   COCAINSCRNUR NONE DETECTED 01/25/2022 2128   LABBENZ NONE DETECTED 01/25/2022 2128   AMPHETMU NONE DETECTED 01/25/2022 2128   THCU NONE DETECTED 01/25/2022 2128   LABBARB NONE DETECTED 01/25/2022 2128    Recent Labs  Lab 01/25/22 2312  ETH <10    I have personally reviewed the radiological images below and agree with the radiology interpretations.  CT ANGIO HEAD NECK W WO CM  Result Date: 01/26/2022 CLINICAL DATA:  Unsteady, leaning towards the right  side EXAM: CT ANGIOGRAPHY HEAD AND NECK TECHNIQUE: Multidetector CT imaging of the head and neck was performed using the standard protocol during bolus administration of intravenous contrast. Multiplanar CT image reconstructions and MIPs were obtained to evaluate the vascular anatomy. Carotid stenosis measurements (when applicable) are obtained utilizing NASCET criteria, using the distal internal carotid diameter as the denominator. RADIATION DOSE REDUCTION: This exam was performed according to the departmental dose-optimization program which includes automated exposure control, adjustment of the mA and/or kV according to patient size and/or use of iterative reconstruction technique. CONTRAST:  OMNIPAQUE IOHEXOL 350 MG/ML SOLN COMPARISON:  No prior CTA, correlation is made with 01/25/2022 CT head FINDINGS: CT HEAD FINDINGS For noncontrast findings, please see same day CT head. CTA NECK FINDINGS Aortic arch:  Two-vessel arch with a common origin of the brachiocephalic and left common carotid arteries. Imaged portion shows no evidence of aneurysm or dissection. No significant stenosis of the major arch vessel origins. Right carotid system: No evidence of dissection, occlusion, or hemodynamically significant stenosis (greater than 50%). Left carotid system: No evidence of dissection, occlusion, or hemodynamically significant stenosis (greater than 50%). Vertebral arteries: No evidence of dissection, occlusion, or hemodynamically significant stenosis (greater than 50%). Skeleton: Degenerative changes in the cervical spine. No acute osseous abnormality. Edentulous. Other neck: No acute finding. Upper chest: No focal pulmonary opacity or pleural effusion. Review of the MIP images confirms the above findings CTA HEAD FINDINGS Evaluation is somewhat limited by bolus timing and degree of venous contamination. Anterior circulation: Both internal carotid arteries are patent to the termini, with calcifications in the distal cavernous segments but without significant stenosis. A1 segments patent. Normal anterior communicating artery. Anterior cerebral arteries are patent to their distal aspects. No M1 stenosis or occlusion. Normal MCA bifurcations. Distal MCA branches are somewhat irregular but otherwise perfused and symmetric. Posterior circulation: Vertebral arteries patent to the vertebrobasilar junction without stenosis. Posterior inferior cerebral arteries patent bilaterally. Basilar patent to its distal aspect. Superior cerebellar arteries patent bilaterally. Patent P1 segments, although the left P1 is quite diminutive. Patent left posterior communicating artery, with near fetal origin of the left PCA. PCAs perfused to their distal aspects without stenosis. Right posterior communicating artery is not visualized. Venous sinuses: As permitted by contrast timing, patent. Anatomic variants: Near fetal origin of the left PCA.  Review of the MIP images confirms the above findings IMPRESSION: 1.  No intracranial large vessel occlusion or significant stenosis. 2.  No hemodynamically significant stenosis in the neck. Electronically Signed   By: Wiliam Ke M.D.   On: 01/26/2022 01:05   CT HEAD WO CONTRAST  Result Date: 01/25/2022 CLINICAL DATA:  Neuro deficit, acute, stroke suspected. Gait abnormality. Dizziness. EXAM: CT HEAD WITHOUT CONTRAST TECHNIQUE: Contiguous axial images were obtained from the base of the skull through the vertex without intravenous contrast. RADIATION DOSE REDUCTION: This exam was performed according to the departmental dose-optimization program which includes automated exposure control, adjustment of the mA and/or kV according to patient size and/or use of iterative reconstruction technique. COMPARISON:  None Available. FINDINGS: Brain: Normal anatomic configuration. Parenchymal volume loss is commensurate with the patient's age. Mild periventricular white matter changes are present likely reflecting the sequela of small vessel ischemia. Age indeterminate infarct involving the left pons extending toward the cerebellar peduncle, best seen on image # 7/2. No abnormal intra or extra-axial mass lesion or fluid collection. No abnormal mass effect or midline shift. No evidence of acute intracranial hemorrhage. Ventricular size is normal. Cerebellum  unremarkable. Vascular: No asymmetric hyperdense vasculature at the skull base. Skull: Intact Sinuses/Orbits: Paranasal sinuses are clear. Orbits are unremarkable. Other: Mastoid air cells and middle ear cavities are clear. 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 Correlation with clinical examination is recommended. If indicated, this would be better assessed with MRI examination. Mild senescent change. Electronically Signed   By: Helyn NumbersAshesh  Parikh M.D.   On: 01/25/2022 22:57   MR BRAIN WO CONTRAST  Result Date: 01/26/2022 CLINICAL DATA:  Neuro  deficit, acute, stroke suspected EXAM: MRI HEAD WITHOUT CONTRAST TECHNIQUE: Multiplanar, multiecho pulse sequences of the brain and surrounding structures were obtained without intravenous contrast. COMPARISON:  CT head May 21, 23. FINDINGS: Brain: No acute infarction, hemorrhage, or hydrocephalus. Small (4.1 x 2.5 cm) arachnoid cyst in the anterior aspect of the right middle cranial fossa with mild mass effect on the adjacent temporal lobe. Mild scattered T2/FLAIR hyperintensities within the white matter, nonspecific but compatible with chronic microvascular ischemic disease. Mild cerebral atrophy. Vascular: Major arterial flow voids are maintained at the skull base. Skull and upper cervical spine: Normal marrow signal. Sinuses/Orbits: Mild paranasal sinus mucosal thickening. No acute orbital findings. Other: No mastoid effusions. IMPRESSION: 1. No evidence of acute intracranial abnormality. 2. 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 3. Small arachnoid cyst in the anterior aspect of the right middle cranial fossa with mild mass effect on the adjacent temporal lobe. Electronically Signed   By: Feliberto HartsFrederick S Jones M.D.   On: 01/26/2022 09:25   US Venous Img Lower Unilateral Right (DVT)  Result Date: 01/26/2022 CLINICAL DATA:  Right lower extremity edema. EXAM: RIGHT LOWER EXTREMITY VENOUS DOPPLER ULTRASOUND TECHNIQUE: Gray-scale sonography with compression, as well as color and duplex ultrasound, were performed to evaluate the deep venous system(s) from the level of the common femoral vein through the popliteal and proximal calf veins. COMPARISON:  None Available. FINDINGS: VENOUS Normal compressibility of the common femoral, superficial femoral, and popliteal veins, as well as the visualized calf veins. Visualized portions of profunda femoral vein and great saphenous vein unremarkable. No filling defects to suggest DVT on grayscale or color Doppler imaging. Doppler waveforms show normal  direction of venous flow, normal respiratory plasticity and response to augmentation. Limited views of the contralateral common femoral vein are unremarkable. OTHER Right lower extremity edema. Limitations: Evaluation/visualization is limited by edema in the right lower extremity. IMPRESSION: No evidence of DVT. Right lower extremity edema, which limits evaluation. Electronically Signed   By: Feliberto HartsFrederick S Jones M.D.   On: 01/26/2022 10:57   ECHOCARDIOGRAM COMPLETE  Result Date: 01/26/2022    ECHOCARDIOGRAM REPORT   Patient Name:   Ian Roberson Date of Exam: 01/26/2022 Medical Rec #:  [id number]       Height:       74.0 in Accession #:    [id number]      Weight:       226.0 lb Date of Birth:  March 15, 1951       BSA:          2.289 m Patient Age:    71 years        BP:           152/77 mmHg Patient Gender: M               HR:           80 bpm. [facility] Procedure: 2D Echo, Cardiac Doppler and Color Doppler Indications:    Stroke  History:  Patient has no prior history of Echocardiogram examinations.                 Stroke; Risk Factors:Hypertension, Diabetes and Dyslipidemia.  Sonographer:    Mikki Harbor Referring Phys: [id number] OLADAPO ADEFESO IMPRESSIONS  1. Left ventricular ejection fraction, by estimation, is 50 to 55%. The left ventricle has low normal function. The left ventricle has no regional wall motion abnormalities. There is moderate left ventricular hypertrophy. Left ventricular diastolic parameters were normal.  2. Right ventricular systolic function is normal. The right ventricular size is mildly enlarged. There is normal pulmonary artery systolic pressure. The estimated right ventricular systolic pressure is 27.6 mmHg.  3. The mitral valve is normal in structure. No evidence of mitral valve regurgitation. No evidence of mitral stenosis.  4. The aortic valve is tricuspid. Aortic valve regurgitation is not visualized. No aortic stenosis is present.  5. The inferior vena cava  is normal in size with greater than 50% respiratory variability, suggesting right atrial pressure of 3 mmHg. FINDINGS  Left Ventricle: Left ventricular ejection fraction, by estimation, is 50 to 55%. The left ventricle has low normal function. The left ventricle has no regional wall motion abnormalities. The left ventricular internal cavity size was normal in size. There is moderate left ventricular hypertrophy. Left ventricular diastolic parameters were normal. Right Ventricle: The right ventricular size is mildly enlarged. No increase in right ventricular wall thickness. Right ventricular systolic function is normal. There is normal pulmonary artery systolic pressure. The tricuspid regurgitant velocity is 2.48  m/s, and with an assumed right atrial pressure of 3 mmHg, the estimated right ventricular systolic pressure is 27.6 mmHg. Left Atrium: Left atrial size was normal in size. Right Atrium: Right atrial size was normal in size. Pericardium: There is no evidence of pericardial effusion. Mitral Valve: The mitral valve is normal in structure. No evidence of mitral valve regurgitation. No evidence of mitral valve stenosis. MV peak gradient, 2.5 mmHg. The mean mitral valve gradient is 1.0 mmHg. Tricuspid Valve: The tricuspid valve is normal in structure. Tricuspid valve regurgitation is trivial. Aortic Valve: The aortic valve is tricuspid. Aortic valve regurgitation is not visualized. No aortic stenosis is present. Aortic valve mean gradient measures 5.0 mmHg. Aortic valve peak gradient measures 9.0 mmHg. Aortic valve area, by VTI measures 2.11 cm. Pulmonic Valve: The pulmonic valve was not well visualized. Pulmonic valve regurgitation is not visualized. Aorta: The aortic root and ascending aorta are structurally normal, with no evidence of dilitation. Venous: The inferior vena cava is normal in size with greater than 50% respiratory variability, suggesting right atrial pressure of 3 mmHg. IAS/Shunts: The  interatrial septum was not well visualized.  LEFT VENTRICLE PLAX 2D LVIDd:         5.50 cm      Diastology LVIDs:         4.10 cm      LV e' medial:    7.94 cm/s LV PW:         1.50 cm      LV E/e' medial:  9.7 LV IVS:        1.40 cm      LV e' lateral:   11.20 cm/s LVOT diam:     2.00 cm      LV E/e' lateral: 6.9 LV SV:         68 LV SV Index:   30 LVOT Area:     3.14 cm  LV Volumes (MOD) LV vol d, MOD  A2C: 57.8 ml LV vol d, MOD A4C: 113.0 ml LV vol s, MOD A2C: 28.2 ml LV vol s, MOD A4C: 53.7 ml LV SV MOD A2C:     29.6 ml LV SV MOD A4C:     113.0 ml LV SV MOD BP:      45.7 ml RIGHT VENTRICLE RV Basal diam:  3.75 cm RV Mid diam:    3.60 cm RV S prime:     15.20 cm/s TAPSE (M-mode): 2.6 cm LEFT ATRIUM           Index        RIGHT ATRIUM           Index LA diam:      3.80 cm 1.66 cm/m   RA Area:     17.00 cm LA Vol (A2C): 52.9 ml 23.11 ml/m  RA Volume:   50.80 ml  22.19 ml/m  AORTIC VALVE                     PULMONIC VALVE AV Area (Vmax):    2.22 cm      PV Vmax:       0.78 m/s AV Area (Vmean):   1.99 cm      PV Peak grad:  2.4 mmHg AV Area (VTI):     2.11 cm AV Vmax:           150.00 cm/s AV Vmean:          100.000 cm/s AV VTI:            0.320 m AV Peak Grad:      9.0 mmHg AV Mean Grad:      5.0 mmHg LVOT Vmax:         106.00 cm/s LVOT Vmean:        63.300 cm/s LVOT VTI:          0.215 m LVOT/AV VTI ratio: 0.67  AORTA Ao Root diam: 3.30 cm Ao Asc diam:  3.30 cm MITRAL VALVE               TRICUSPID VALVE MV Area (PHT): 4.01 cm    TR Peak grad:   24.6 mmHg MV Area VTI:   2.71 cm    TR Vmax:        248.00 cm/s MV Peak grad:  2.5 mmHg MV Mean grad:  1.0 mmHg    SHUNTS MV Vmax:       0.79 m/s    Systemic VTI:  0.22 m MV Vmean:      51.7 cm/s   Systemic Diam: 2.00 cm MV Decel Time: 189 msec MV E velocity: 77.10 cm/s MV A velocity: 72.80 cm/s MV E/A ratio:  1.06 Epifanio Lesches MD Electronically signed by Epifanio Lesches MD Signature Date/Time: 01/26/2022/11:00:06 AM    Final      PHYSICAL  EXAM  Temp:  [98.7 F (37.1 C)-98.9 F (37.2 C)] 98.7 F (37.1 C) (05/21 2050) Pulse Rate:  [76-104] 77 (05/22 1030) Resp:  [13-29] 18 (05/22 1030) BP: (141-186)/(76-110) 153/84 (05/22 1030) SpO2:  [97 %-100 %] 100 % (05/22 1030) FiO2 (%):  [21 %] 21 % (05/22 0055) Weight:  [102.5 kg] 102.5 kg (05/21 2050)  General - Well nourished, well developed, in no apparent distress.  Ophthalmologic - fundi not visualized due to noncooperation.  Cardiovascular - Regular rhythm and rate.  Mental Status -  Level of arousal and orientation to time, place, and person were intact. Language including expression,  naming, repetition, comprehension was assessed and found intact. Attention span and concentration were normal. Fund of Knowledge was assessed and was intact.  Cranial Nerves II - XII - II - Visual field intact OU. III, IV, VI - Extraocular movements intact.  No disconjugate eyes V - Facial sensation intact bilaterally. VII - Facial movement intact bilaterally. VIII - Hearing & vestibular intact bilaterally.  No nystagmus X - Palate elevates symmetrically. XI - Chin turning & shoulder shrug intact bilaterally. XII - Tongue protrusion intact.  Motor Strength - The patient's strength was normal in both upper extremities and pronator drift was absent.  Left lower extremity at least 4+/5.  Right lower extremity 3/5 with drift.  Right lower extremity swollen.  Bulk was normal and fasciculations were absent.   Motor Tone - Muscle tone was assessed at the neck and appendages and was normal.  Sensory - Light touch, temperature/pinprick were assessed and were symmetrical.    Coordination - The patient had normal movements in the hands and feet with no ataxia or dysmetria.  Tremor was absent.  Right heel-to-shin slow and weak on exam  Gait and Station - deferred.   ASSESSMENT/PLAN Mr. Clemente Dewey is a 70 y.o. male with history of hypertension, hyperlipidemia, diabetes, gout, lower  extremity edema admitted for lightheadedness, imbalance gait with right leaning.  Right leg edema with mild weakness Resultant right leg significant swollen and weaker than left. The mild weakness likely due to swelling. MRI no acute stroke CT head and neck unremarkable 2D Echo EF 50 to 55% LE venous Doppler no DVT on the right LE arterial Doppler normal ABIs LDL 82 HgbA1c 5.8 No antithrombotic prior to admission, now on aspirin 81 mg daily. Continue ASA on discharge. Patient counseled to be compliant with his antithrombotic medications Ongoing aggressive stroke risk factor management Therapy recommendations:  outpt PT Further management per primary team Recommend to follow up as outpt for right leg chronic swelling  Diabetes HgbA1c 5.8 goal < 7.0 Controlled CBG monitoring SSI DM education and close PCP follow up  Hypertension Stable Long term BP goal normotensive  Hyperlipidemia Home meds: Lipitor 20 LDL 82, goal < 70 Now on Lipitor 40 Continue statin at discharge  Other Stroke Risk Factors   Other Active Problems Gout   Hospital day # 0  Neurology will sign off. Please call with questions.  No neuro follow-up needed at this time thanks for the consult.   Marvel Plan, MD PhD Stroke Neurology 01/26/2022 11:35 AM  This consult was provided via telemedicine with 2-way video and audio communication. The patient/family was informed that care would be provided in this way and agreed to receive care in this manner.   This patient is receiving care for possible acute neurological changes. There was 40 minutes of care by this provider at the time of service, including time for direct evaluation via telemedicine, review of medical records, imaging studies and discussion of findings with providers, the patient and/or family. I discussed with Dr. Sherryll Burger.   To contact Stroke Continuity provider, please refer to [email or web address]. After hours, contact General Neurology

## 2022-01-26 NOTE — Discharge Summary (Signed)
Physician Discharge Summary  [name] ZOX:[id number] DOB: [birth year] DOA: 01/25/2022  PCP: [facility]  Admit date: 01/25/2022  Discharge date: 01/26/2022  Admitted From:Home  Disposition:  Home  Recommendations for Outpatient Follow-up:  Follow up with PCP in 1-2 weeks Continue aspirin 81 mg daily as well as Lipitor 40 mg daily Continue other home medications as prior  Home Health: None, outpatient PT  Equipment/Devices: Rolling walker  Discharge Condition:Stable  CODE STATUS: Full  Diet recommendation: Heart Healthy/carb modified  Brief/Interim Summary: Ian Roberson is a 71 y.o. male with medical history significant of hypertension, T2DM and gout who presents to the emergency department due to 1 day onset of ambulatory difficulty and dizziness.  He was admitted for gait and balance which was initially thought to be related to TIA/CVA.  No CVA noted on examination and patient was seen by neurology with gait instability likely more related to right lower extremity chronic swelling.  No DVT or signs of PVD noted on ultrasound.  LDL 82 and hemoglobin A1c 8.1%.  2D echocardiogram with EF 50-55%.  Recommendations are to continue aspirin on discharge as well as Lipitor 40 mg daily.  No other acute events noted.  Discharge Diagnoses:  Principal Problem:   Unsteady gait Active Problems:   Essential hypertension   Type 2 diabetes mellitus (HCC)   Gout   Essential tremor  Principal discharge diagnosis: Unsteady gait likely related to TIA versus right lower extremity edema-chronic.  Discharge Instructions  Discharge Instructions     Ambulatory referral to Physical Therapy   Complete by: As directed    Diet - low sodium heart healthy   Complete by: As directed    Increase activity slowly   Complete by: As directed       Allergies as of 01/26/2022       Reactions   Penicillins Other (See Comments)   Convulsions Has patient had a PCN reaction causing  immediate rash, facial/tongue/throat swelling, SOB or lightheadedness with hypotension: No Has patient had a PCN reaction causing severe rash involving mucus membranes or skin necrosis: No Has patient had a PCN reaction that required hospitalization No Has patient had a PCN reaction occurring within the last 10 years: No If all of the above answers are "NO", then may proceed with Cephalosporin use.   Erythromycin Itching        Medication List     STOP taking these medications    doxycycline 100 MG capsule Commonly known as: VIBRAMYCIN       TAKE these medications    albuterol 108 (90 Base) MCG/ACT inhaler Commonly known as: VENTOLIN HFA Inhale 1-2 puffs into the lungs every 4 (four) hours as needed for wheezing or shortness of breath.   allopurinol 100 MG tablet Commonly known as: ZYLOPRIM Take 100 mg by mouth daily.   amLODipine 10 MG tablet Commonly known as: NORVASC Take 10 mg by mouth daily.   aspirin EC 81 MG tablet Take 1 tablet (81 mg total) by mouth daily. Swallow whole. Start taking on: Jan 27, 2022   atorvastatin 40 MG tablet Commonly known as: LIPITOR Take 1 tablet (40 mg total) by mouth daily at 6 PM. Start taking on: Jan 27, 2022 What changed:  medication strength how much to take when to take this   azelastine 0.1 % nasal spray Commonly known as: ASTELIN Place 1 spray into both nostrils 2 (two) times daily. Use in each nostril as directed What changed:  when to take this reasons  to take this   fluticasone 50 MCG/ACT nasal spray Commonly known as: FLONASE Place 1 spray into both nostrils daily. What changed:  when to take this reasons to take this   metFORMIN 500 MG tablet Commonly known as: GLUCOPHAGE Take 500 mg by mouth in the morning and at bedtime.   sodium chloride 0.65 % Soln nasal spray Commonly known as: OCEAN Place 1 spray into both nostrils as needed for congestion.   traMADol 50 MG tablet Commonly known as: ULTRAM Take  50 mg by mouth every 6 (six) hours as needed for moderate pain.               Durable Medical Equipment  (From admission, onward)           Start     Ordered   01/26/22 1251  For home use only DME Walker rolling  Once       Question Answer Comment  Walker: With 5 Inch Wheels   Patient needs a walker to treat with the following condition Weakness      01/26/22 1250            Follow-up Information     [facility]. Schedule an appointment as soon as possible for a visit in 1 week(s).   Specialty: General Practice Contact information: [address] [place] [address] [phone]                Allergies  Allergen Reactions   Penicillins Other (See Comments)    Convulsions Has patient had a PCN reaction causing immediate rash, facial/tongue/throat swelling, SOB or lightheadedness with hypotension: No Has patient had a PCN reaction causing severe rash involving mucus membranes or skin necrosis: No Has patient had a PCN reaction that required hospitalization No Has patient had a PCN reaction occurring within the last 10 years: No If all of the above answers are "NO", then may proceed with Cephalosporin use.    Erythromycin Itching    Consultations: Neurology   Procedures/Studies: CT ANGIO HEAD NECK W WO CM  Result Date: 01/26/2022 CLINICAL DATA:  Unsteady, leaning towards the right side EXAM: CT ANGIOGRAPHY HEAD AND NECK TECHNIQUE: Multidetector CT imaging of the head and neck was performed using the standard protocol during bolus administration of intravenous contrast. Multiplanar CT image reconstructions and MIPs were obtained to evaluate the vascular anatomy. Carotid stenosis measurements (when applicable) are obtained utilizing NASCET criteria, using the distal internal carotid diameter as the denominator. RADIATION DOSE REDUCTION: This exam was performed according to the departmental dose-optimization program which includes automated  exposure control, adjustment of the mA and/or kV according to patient size and/or use of iterative reconstruction technique. CONTRAST:  OMNIPAQUE IOHEXOL 350 MG/ML SOLN COMPARISON:  No prior CTA, correlation is made with 01/25/2022 CT head FINDINGS: CT HEAD FINDINGS For noncontrast findings, please see same day CT head. CTA NECK FINDINGS Aortic arch: Two-vessel arch with a common origin of the brachiocephalic and left common carotid arteries. Imaged portion shows no evidence of aneurysm or dissection. No significant stenosis of the major arch vessel origins. Right carotid system: No evidence of dissection, occlusion, or hemodynamically significant stenosis (greater than 50%). Left carotid system: No evidence of dissection, occlusion, or hemodynamically significant stenosis (greater than 50%). Vertebral arteries: No evidence of dissection, occlusion, or hemodynamically significant stenosis (greater than 50%). Skeleton: Degenerative changes in the cervical spine. No acute osseous abnormality. Edentulous. Other neck: No acute finding. Upper chest: No focal pulmonary opacity or pleural effusion.  Review of the MIP images confirms the above findings CTA HEAD FINDINGS Evaluation is somewhat limited by bolus timing and degree of venous contamination. Anterior circulation: Both internal carotid arteries are patent to the termini, with calcifications in the distal cavernous segments but without significant stenosis. A1 segments patent. Normal anterior communicating artery. Anterior cerebral arteries are patent to their distal aspects. No M1 stenosis or occlusion. Normal MCA bifurcations. Distal MCA branches are somewhat irregular but otherwise perfused and symmetric. Posterior circulation: Vertebral arteries patent to the vertebrobasilar junction without stenosis. Posterior inferior cerebral arteries patent bilaterally. Basilar patent to its distal aspect. Superior cerebellar arteries patent bilaterally. Patent P1  segments, although the left P1 is quite diminutive. Patent left posterior communicating artery, with near fetal origin of the left PCA. PCAs perfused to their distal aspects without stenosis. Right posterior communicating artery is not visualized. Venous sinuses: As permitted by contrast timing, patent. Anatomic variants: Near fetal origin of the left PCA. Review of the MIP images confirms the above findings IMPRESSION: 1.  No intracranial large vessel occlusion or significant stenosis. 2.  No hemodynamically significant stenosis in the neck. Electronically Signed   By: Wiliam Ke M.D.   On: 01/26/2022 01:05   CT HEAD WO CONTRAST  Result Date: 01/25/2022 CLINICAL DATA:  Neuro deficit, acute, stroke suspected. Gait abnormality. Dizziness. EXAM: CT HEAD WITHOUT CONTRAST TECHNIQUE: Contiguous axial images were obtained from the base of the skull through the vertex without intravenous contrast. RADIATION DOSE REDUCTION: This exam was performed according to the departmental dose-optimization program which includes automated exposure control, adjustment of the mA and/or kV according to patient size and/or use of iterative reconstruction technique. COMPARISON:  None Available. FINDINGS: Brain: Normal anatomic configuration. Parenchymal volume loss is commensurate with the patient's age. Mild periventricular white matter changes are present likely reflecting the sequela of small vessel ischemia. Age indeterminate infarct involving the left pons extending toward the cerebellar peduncle, best seen on image # 7/2. No abnormal intra or extra-axial mass lesion or fluid collection. No abnormal mass effect or midline shift. No evidence of acute intracranial hemorrhage. Ventricular size is normal. Cerebellum unremarkable. Vascular: No asymmetric hyperdense vasculature at the skull base. Skull: Intact Sinuses/Orbits: Paranasal sinuses are clear. Orbits are unremarkable. Other: Mastoid air cells and middle ear cavities are  clear. 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 Correlation with clinical examination is recommended. If indicated, this would be better assessed with MRI examination. Mild senescent change. Electronically Signed   By: Helyn Numbers M.D.   On: 01/25/2022 22:57   MR BRAIN WO CONTRAST  Result Date: 01/26/2022 CLINICAL DATA:  Neuro deficit, acute, stroke suspected EXAM: MRI HEAD WITHOUT CONTRAST TECHNIQUE: Multiplanar, multiecho pulse sequences of the brain and surrounding structures were obtained without intravenous contrast. COMPARISON:  CT head May 21, 23. FINDINGS: Brain: No acute infarction, hemorrhage, or hydrocephalus. Small (4.1 x 2.5 cm) arachnoid cyst in the anterior aspect of the right middle cranial fossa with mild mass effect on the adjacent temporal lobe. Mild scattered T2/FLAIR hyperintensities within the white matter, nonspecific but compatible with chronic microvascular ischemic disease. Mild cerebral atrophy. Vascular: Major arterial flow voids are maintained at the skull base. Skull and upper cervical spine: Normal marrow signal. Sinuses/Orbits: Mild paranasal sinus mucosal thickening. No acute orbital findings. Other: No mastoid effusions. IMPRESSION: 1. No evidence of acute intracranial abnormality. 2. 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 3. Small arachnoid cyst in the anterior aspect of the right middle cranial fossa with  mild mass effect on the adjacent temporal lobe. Electronically Signed   By: Feliberto Harts M.D.   On: 01/26/2022 09:25   US Venous Img Lower Unilateral Right (DVT)  Result Date: 01/26/2022 CLINICAL DATA:  Right lower extremity edema. EXAM: RIGHT LOWER EXTREMITY VENOUS DOPPLER ULTRASOUND TECHNIQUE: Gray-scale sonography with compression, as well as color and duplex ultrasound, were performed to evaluate the deep venous system(s) from the level of the common femoral vein through the popliteal and proximal calf veins.  COMPARISON:  None Available. FINDINGS: VENOUS Normal compressibility of the common femoral, superficial femoral, and popliteal veins, as well as the visualized calf veins. Visualized portions of profunda femoral vein and great saphenous vein unremarkable. No filling defects to suggest DVT on grayscale or color Doppler imaging. Doppler waveforms show normal direction of venous flow, normal respiratory plasticity and response to augmentation. Limited views of the contralateral common femoral vein are unremarkable. OTHER Right lower extremity edema. Limitations: Evaluation/visualization is limited by edema in the right lower extremity. IMPRESSION: No evidence of DVT. Right lower extremity edema, which limits evaluation. Electronically Signed   By: Feliberto Harts M.D.   On: 01/26/2022 10:57   US ARTERIAL ABI (SCREENING LOWER EXTREMITY)  Result Date: 01/26/2022 CLINICAL DATA:  Bilateral rest and claudication symptoms. Former smoker. History of hypertension, hyperlipidemia and diabetes. Evaluate for PAD. EXAM: NONINVASIVE PHYSIOLOGIC VASCULAR STUDY OF BILATERAL LOWER EXTREMITIES TECHNIQUE: Evaluation of both lower extremities were performed at rest, including calculation of ankle-brachial indices with single level Doppler, pressure and pulse volume recording. COMPARISON:  None Available. FINDINGS: Right ABI:  1.4 Left ABI:  1.4 Right Lower Extremity: Normal triphasic waveforms are demonstrated within the right posterior tibial artery. Biphasic waveforms are demonstrated within the right dorsalis pedis artery. Left Lower Extremity: Normal triphasic waveforms are demonstrated within both the left posterior tibial and dorsalis pedis arteries. IMPRESSION: Normal ABIs bilaterally. Electronically Signed   By: Simonne Come M.D.   On: 01/26/2022 11:37   ECHOCARDIOGRAM COMPLETE  Result Date: 01/26/2022    ECHOCARDIOGRAM REPORT   Patient Name:   Ian Roberson Date of Exam: 01/26/2022 Medical Rec #:  [id number]        Height:       74.0 in Accession #:    [id number]      Weight:       226.0 lb Date of Birth:  05/04/1951       BSA:          2.289 m Patient Age:    20 years        BP:           152/77 mmHg Patient Gender: M               HR:           80 bpm. [facility] Procedure: 2D Echo, Cardiac Doppler and Color Doppler Indications:    Stroke  History:        Patient has no prior history of Echocardiogram examinations.                 Stroke; Risk Factors:Hypertension, Diabetes and Dyslipidemia.  Sonographer:    Mikki Harbor Referring Phys: [id number] OLADAPO ADEFESO IMPRESSIONS  1. Left ventricular ejection fraction, by estimation, is 50 to 55%. The left ventricle has low normal function. The left ventricle has no regional wall motion abnormalities. There is moderate left ventricular hypertrophy. Left ventricular diastolic parameters were normal.  2. Right ventricular systolic function is normal.  The right ventricular size is mildly enlarged. There is normal pulmonary artery systolic pressure. The estimated right ventricular systolic pressure is 27.6 mmHg.  3. The mitral valve is normal in structure. No evidence of mitral valve regurgitation. No evidence of mitral stenosis.  4. The aortic valve is tricuspid. Aortic valve regurgitation is not visualized. No aortic stenosis is present.  5. The inferior vena cava is normal in size with greater than 50% respiratory variability, suggesting right atrial pressure of 3 mmHg. FINDINGS  Left Ventricle: Left ventricular ejection fraction, by estimation, is 50 to 55%. The left ventricle has low normal function. The left ventricle has no regional wall motion abnormalities. The left ventricular internal cavity size was normal in size. There is moderate left ventricular hypertrophy. Left ventricular diastolic parameters were normal. Right Ventricle: The right ventricular size is mildly enlarged. No increase in right ventricular wall thickness. Right ventricular systolic  function is normal. There is normal pulmonary artery systolic pressure. The tricuspid regurgitant velocity is 2.48  m/s, and with an assumed right atrial pressure of 3 mmHg, the estimated right ventricular systolic pressure is 27.6 mmHg. Left Atrium: Left atrial size was normal in size. Right Atrium: Right atrial size was normal in size. Pericardium: There is no evidence of pericardial effusion. Mitral Valve: The mitral valve is normal in structure. No evidence of mitral valve regurgitation. No evidence of mitral valve stenosis. MV peak gradient, 2.5 mmHg. The mean mitral valve gradient is 1.0 mmHg. Tricuspid Valve: The tricuspid valve is normal in structure. Tricuspid valve regurgitation is trivial. Aortic Valve: The aortic valve is tricuspid. Aortic valve regurgitation is not visualized. No aortic stenosis is present. Aortic valve mean gradient measures 5.0 mmHg. Aortic valve peak gradient measures 9.0 mmHg. Aortic valve area, by VTI measures 2.11 cm. Pulmonic Valve: The pulmonic valve was not well visualized. Pulmonic valve regurgitation is not visualized. Aorta: The aortic root and ascending aorta are structurally normal, with no evidence of dilitation. Venous: The inferior vena cava is normal in size with greater than 50% respiratory variability, suggesting right atrial pressure of 3 mmHg. IAS/Shunts: The interatrial septum was not well visualized.  LEFT VENTRICLE PLAX 2D LVIDd:         5.50 cm      Diastology LVIDs:         4.10 cm      LV e' medial:    7.94 cm/s LV PW:         1.50 cm      LV E/e' medial:  9.7 LV IVS:        1.40 cm      LV e' lateral:   11.20 cm/s LVOT diam:     2.00 cm      LV E/e' lateral: 6.9 LV SV:         68 LV SV Index:   30 LVOT Area:     3.14 cm  LV Volumes (MOD) LV vol d, MOD A2C: 57.8 ml LV vol d, MOD A4C: 113.0 ml LV vol s, MOD A2C: 28.2 ml LV vol s, MOD A4C: 53.7 ml LV SV MOD A2C:     29.6 ml LV SV MOD A4C:     113.0 ml LV SV MOD BP:      45.7 ml RIGHT VENTRICLE RV Basal diam:   3.75 cm RV Mid diam:    3.60 cm RV S prime:     15.20 cm/s TAPSE (M-mode): 2.6 cm LEFT ATRIUM  Index        RIGHT ATRIUM           Index LA diam:      3.80 cm 1.66 cm/m   RA Area:     17.00 cm LA Vol (A2C): 52.9 ml 23.11 ml/m  RA Volume:   50.80 ml  22.19 ml/m  AORTIC VALVE                     PULMONIC VALVE AV Area (Vmax):    2.22 cm      PV Vmax:       0.78 m/s AV Area (Vmean):   1.99 cm      PV Peak grad:  2.4 mmHg AV Area (VTI):     2.11 cm AV Vmax:           150.00 cm/s AV Vmean:          100.000 cm/s AV VTI:            0.320 m AV Peak Grad:      9.0 mmHg AV Mean Grad:      5.0 mmHg LVOT Vmax:         106.00 cm/s LVOT Vmean:        63.300 cm/s LVOT VTI:          0.215 m LVOT/AV VTI ratio: 0.67  AORTA Ao Root diam: 3.30 cm Ao Asc diam:  3.30 cm MITRAL VALVE               TRICUSPID VALVE MV Area (PHT): 4.01 cm    TR Peak grad:   24.6 mmHg MV Area VTI:   2.71 cm    TR Vmax:        248.00 cm/s MV Peak grad:  2.5 mmHg MV Mean grad:  1.0 mmHg    SHUNTS MV Vmax:       0.79 m/s    Systemic VTI:  0.22 m MV Vmean:      51.7 cm/s   Systemic Diam: 2.00 cm MV Decel Time: 189 msec MV E velocity: 77.10 cm/s MV A velocity: 72.80 cm/s MV E/A ratio:  1.06 Epifanio Lesches MD Electronically signed by Epifanio Lesches MD Signature Date/Time: 01/26/2022/11:00:06 AM    Final      Discharge Exam: Vitals:   01/26/22 0730 01/26/22 1030  BP: (!) 148/85 (!) 153/84  Pulse: 89 77  Resp: 18 18  Temp:    SpO2: 98% 100%   Vitals:   01/26/22 0630 01/26/22 0700 01/26/22 0730 01/26/22 1030  BP: (!) 152/77 (!) 160/86 (!) 148/85 (!) 153/84  Pulse: 81 77 89 77  Resp: Temp:      TempSrc:      SpO2: 98% 97% 98% 100%  Weight:      Height:        General: Pt is alert, awake, not in acute distress Cardiovascular: RRR, S1/S2 +, no rubs, no gallops Respiratory: CTA bilaterally, no wheezing, no rhonchi Abdominal: Soft, NT, ND, bowel sounds + Extremities: Right lower extremity pitting edema,  no cyanosis    The results of significant diagnostics from this hospitalization (including imaging, microbiology, ancillary and laboratory) are listed below for reference.     Microbiology: Recent Results (from the past 240 hour(s))  Resp Panel by RT-PCR (Flu A&B, Covid) Nasopharyngeal Swab     Status: None   Collection Time: 01/25/22  9:55 PM   Specimen: Nasopharyngeal Swab; Nasopharyngeal(NP) swabs in vial  transport medium  Result Value Ref Range Status   SARS Coronavirus 2 by RT PCR NEGATIVE NEGATIVE Final    Comment: (NOTE) SARS-CoV-2 target nucleic acids are NOT DETECTED.  The SARS-CoV-2 RNA is generally detectable in upper respiratory specimens during the acute phase of infection. The lowest concentration of SARS-CoV-2 viral copies this assay can detect is 138 copies/mL. A negative result does not preclude SARS-Cov-2 infection and should not be used as the sole basis for treatment or other patient management decisions. A negative result may occur with  improper specimen collection/handling, submission of specimen other than nasopharyngeal swab, presence of viral mutation(s) within the areas targeted by this assay, and inadequate number of viral copies(<138 copies/mL). A negative result must be combined with clinical observations, patient history, and epidemiological information. The expected result is Negative.  Fact Sheet for Patients:  BloggerCourse.com  Fact Sheet for Healthcare Providers:  SeriousBroker.it  This test is no t yet approved or cleared by the [place] FDA and  has been authorized for detection and/or diagnosis of SARS-CoV-2 by FDA under an Emergency Use Authorization (EUA). This EUA will remain  in effect (meaning this test can be used) for the duration of the COVID-19 declaration under Section 564(b)(1) of the Act, 21 U.S.C.section 360bbb-3(b)(1), unless the authorization is terminated  or revoked  sooner.       Influenza A by PCR NEGATIVE NEGATIVE Final   Influenza B by PCR NEGATIVE NEGATIVE Final    Comment: (NOTE) The Xpert Xpress SARS-CoV-2/FLU/RSV plus assay is intended as an aid in the diagnosis of influenza from Nasopharyngeal swab specimens and should not be used as a sole basis for treatment. Nasal washings and aspirates are unacceptable for Xpert Xpress SARS-CoV-2/FLU/RSV testing.  Fact Sheet for Patients: BloggerCourse.com  Fact Sheet for Healthcare Providers: SeriousBroker.it  This test is not yet approved or cleared by the [place] FDA and has been authorized for detection and/or diagnosis of SARS-CoV-2 by FDA under an Emergency Use Authorization (EUA). This EUA will remain in effect (meaning this test can be used) for the duration of the COVID-19 declaration under Section 564(b)(1) of the Act, 21 U.S.C. section 360bbb-3(b)(1), unless the authorization is terminated or revoked.  Performed at [facility], [address], [place], [place] [address]      Labs: BNP (last 3 results) No results for input(s): BNP in the last 8760 hours. Basic Metabolic Panel: Recent Labs  Lab 01/25/22 2013 01/26/22 0002 01/26/22 0345  NA 139 140 138  K 4.0 4.1 3.3*  CL 108 104 110  CO2 24  --  23  GLUCOSE 107* 93 101*  BUN 11 10 10   CREATININE 0.85 0.90 0.81  CALCIUM 9.8  --  9.1  MG  --   --  1.6*  PHOS  --   --  3.5   Liver Function Tests: Recent Labs  Lab 01/25/22 2312 01/26/22 0345  AST 19 17  ALT 11 12  ALKPHOS 62 55  BILITOT 0.6 0.8  PROT 6.8 6.1*  ALBUMIN 3.9 3.6   No results for input(s): LIPASE, AMYLASE in the last 168 hours. No results for input(s): AMMONIA in the last 168 hours. CBC: Recent Labs  Lab 01/25/22 2013 01/26/22 0002 01/26/22 0345  WBC 8.6  --  8.0  NEUTROABS 4.7  --   --   HGB 13.6 14.6 12.4*  HCT 41.4 43.0 36.5*  MCV 85.5  --  83.5  PLT 197  --  189   Cardiac  Enzymes: No results for input(s): CKTOTAL, CKMB, CKMBINDEX, TROPONINI in the last 168 hours. BNP: Invalid input(s): POCBNP CBG: Recent Labs  Lab 01/26/22 0011 01/26/22 0337 01/26/22 0829 01/26/22 1208  GLUCAP 95 105* 105* 105*   D-Dimer No results for input(s): DDIMER in the last 72 hours. Hgb A1c Recent Labs    01/26/22 0345  HGBA1C 5.8*   Lipid Profile Recent Labs    01/26/22 0345  CHOL 154  HDL 64  LDLCALC 82  TRIG 41  CHOLHDL 2.4   Thyroid function studies No results for input(s): TSH, T4TOTAL, T3FREE, THYROIDAB in the last 72 hours.  Invalid input(s): FREET3 Anemia work up No results for input(s): VITAMINB12, FOLATE, FERRITIN, TIBC, IRON, RETICCTPCT in the last 72 hours. Urinalysis    Component Value Date/Time   COLORURINE YELLOW 01/25/2022 2128   APPEARANCEUR CLEAR 01/25/2022 2128   LABSPEC 1.017 01/25/2022 2128   PHURINE 5.0 01/25/2022 2128   GLUCOSEU NEGATIVE 01/25/2022 2128   HGBUR NEGATIVE 01/25/2022 2128   BILIRUBINUR NEGATIVE 01/25/2022 2128   KETONESUR NEGATIVE 01/25/2022 2128   PROTEINUR NEGATIVE 01/25/2022 2128   NITRITE NEGATIVE 01/25/2022 2128   LEUKOCYTESUR NEGATIVE 01/25/2022 2128   [facility] Invalid input(s): PROCALCITONIN,  WBC,  LACTICIDVEN Microbiology Recent Results (from the past 240 hour(s))  Resp Panel by RT-PCR (Flu A&B, Covid) Nasopharyngeal Swab     Status: None   Collection Time: 01/25/22  9:55 PM   Specimen: Nasopharyngeal Swab; Nasopharyngeal(NP) swabs in vial transport medium  Result Value Ref Range Status   SARS Coronavirus 2 by RT PCR NEGATIVE NEGATIVE Final    Comment: (NOTE) SARS-CoV-2 target nucleic acids are NOT DETECTED.  The SARS-CoV-2 RNA is generally detectable in upper respiratory specimens during the acute phase of infection. The lowest concentration of SARS-CoV-2 viral copies this assay can detect is 138 copies/mL. A negative result does not preclude SARS-Cov-2 infection and should not be used as the  sole basis for treatment or other patient management decisions. A negative result may occur with  improper specimen collection/handling, submission of specimen other than nasopharyngeal swab, presence of viral mutation(s) within the areas targeted by this assay, and inadequate number of viral copies(<138 copies/mL). A negative result must be combined with clinical observations, patient history, and epidemiological information. The expected result is Negative.  Fact Sheet for Patients:  BloggerCourse.com  Fact Sheet for Healthcare Providers:  SeriousBroker.it  This test is no t yet approved or cleared by the [place] FDA and  has been authorized for detection and/or diagnosis of SARS-CoV-2 by FDA under an Emergency Use Authorization (EUA). This EUA will remain  in effect (meaning this test can be used) for the duration of the COVID-19 declaration under Section 564(b)(1) of the Act, 21 U.S.C.section 360bbb-3(b)(1), unless the authorization is terminated  or revoked sooner.       Influenza A by PCR NEGATIVE NEGATIVE Final   Influenza B by PCR NEGATIVE NEGATIVE Final    Comment: (NOTE) The Xpert Xpress SARS-CoV-2/FLU/RSV plus assay is intended as an aid in the diagnosis of influenza from Nasopharyngeal swab specimens and should not be used as a sole basis for treatment. Nasal washings and aspirates are unacceptable for Xpert Xpress SARS-CoV-2/FLU/RSV testing.  Fact Sheet for Patients: BloggerCourse.com  Fact Sheet for Healthcare Providers: SeriousBroker.it  This test is not yet approved or cleared by the [place] FDA and has been authorized for detection and/or diagnosis of SARS-CoV-2 by FDA under an Emergency Use Authorization (EUA). This EUA will remain in  effect (meaning this test can be used) for the duration of the COVID-19 declaration under Section 564(b)(1) of the  Act, 21 U.S.C. section 360bbb-3(b)(1), unless the authorization is terminated or revoked.  Performed at [facility], [address] [facility] [address] [place], [place] [address]      Time coordinating discharge: 35 minutes  SIGNED:   Erick BlinksPratik D Tashika Goodin, DO [facility] 01/26/2022, 12:54 PM  If 7PM-7AM, please contact night-coverage [email or web address]

## 2022-01-26 NOTE — ED Notes (Signed)
Patient to MRI.

## 2022-01-26 NOTE — TOC Progression Note (Signed)
Transition of Care [facility]) - Progression Note    Patient Details  Name: Ian Roberson MRN: [id number] Date of Birth: 01-15-1951  Transition of Care [facility]) CM/SW Contact  Salome Arnt, [place] Phone Number: 01/26/2022, 7:54 AM  Clinical Narrative:  [place] notification completed. Notification ID: [id number].          Expected Discharge Plan and Services                                                 Social Determinants of Health (SDOH) Interventions    Readmission Risk Interventions     View : No data to display.

## 2022-01-26 NOTE — ED Notes (Signed)
Notified Dr. Thomes Dinning patient reports left-sided posterior headache. Awaiting orders.

## 2022-01-26 NOTE — TOC Transition Note (Signed)
Transition of Care [facility]) - CM/SW Discharge Note   Patient Details  Name: Jaskaran Dauzat MRN: [id number] Date of Birth: 1951-05-11  Transition of [facility]) CM/SW Contact:  Karn Cassis, LCSW Phone Number: 01/26/2022, 12:58 PM   Clinical Narrative:   Pt d/c today. PT evaluated pt and recommend OOPT. Discussed with pt's wife who is agreeable to [facility]. Referral sent. Pt requests rolling walker. No preference on agency. Referred to [facility] with [employer] who will drop ship rolling walker to pt's home. No other needs reported.     Final next level of care: OP Rehab Barriers to Discharge: Barriers Resolved   Patient Goals and CMS Choice Patient states their goals for this hospitalization and ongoing recovery are:: return home   Choice offered to / list presented to : Patient, Spouse  Discharge Placement                    Patient and family notified of of transfer: 01/26/22  Discharge Plan and Services                DME Arranged: Dan Humphreys rolling DME Agency: [employer] Date DME Agency Contacted: 01/26/22 Time DME Agency Contacted: [phone] Representative spoke with at DME Agency: Morrie Sheldon            Social Determinants of Health (SDOH) Interventions     Readmission Risk Interventions     View : No data to display.

## 2022-01-26 NOTE — ED Notes (Addendum)
PT/OT evaluating patient at this time.

## 2022-01-26 NOTE — ED Notes (Signed)
Patient given diet sprite and crackers after passing swallow screen.

## 2022-01-26 NOTE — Progress Notes (Signed)
*  PRELIMINARY RESULTS* Echocardiogram 2D Echocardiogram has been performed.  Carolyne Fiscal 01/26/2022, 10:28 AM

## 2022-01-26 NOTE — ED Notes (Signed)
Neurologist on screen assessing patient at this time.

## 2022-01-26 NOTE — ED Notes (Signed)
Patient returned from MRI at this time.  

## 2022-01-26 NOTE — ED Notes (Signed)
Patient transported to CT 

## 2022-01-26 NOTE — ED Notes (Signed)
Echo in progress.

## 2022-02-05 ENCOUNTER — Ambulatory Visit (HOSPITAL_COMMUNITY): Payer: Non-veteran care | Admitting: Physical Therapy

## 2022-02-20 ENCOUNTER — Ambulatory Visit (HOSPITAL_COMMUNITY): Payer: Medicare PPO | Attending: Internal Medicine

## 2022-06-29 NOTE — Therapy (Addendum)
OUTPATIENT PHYSICAL THERAPY CERVICAL EVALUATION   Patient Name: Ian Roberson MRN: [id number] DOB:Nov 09, 1950, 71 y.o., male Today's Date: 06/30/2022   PT End of Session - 06/30/22 1043     Visit Number 1    Number of Visits 8    Date for PT Re-Evaluation 07/28/22    Authorization Type [facility]/ veterans administration    Authorization - Number of Visits 15    PT Start Time 23    PT Stop Time 46    PT Time Calculation (min) 40 min             Past Medical History:  Diagnosis Date   Diabetes mellitus without complication ([place])    Gout    Hyperlipidemia    Hypertension    Past Surgical History:  Procedure Laterality Date   FOOT SURGERY     KNEE SURGERY     NECK SURGERY     Patient Active Problem List   Diagnosis Date Noted   Acute CVA (cerebrovascular accident) ([place]) 01/26/2022   Unsteady gait 01/26/2022   Type 2 diabetes mellitus ([place]) 01/26/2022   Gout 01/26/2022   Essential tremor 01/26/2022   Fever 05/11/2016   Malaise 05/11/2016   Tachypnea 05/11/2016   Essential hypertension    Hyperlipidemia    Sepsis ([place])     [facility]  REFERRING PROVIDER: Pt Eval And Tx For 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 Christella Noa by [place]; referred to therapy; sleep is disturbed; pain in his neck wakes him up  PERTINENT HISTORY:  DM Lyphoma removed 2 years ago right upper trap  area  PAIN:  Are you having pain? Yes: NPRS scale: 4-8/10 Pain location: back of neck and down the arms Pain description: sore and cold Aggravating factors: sleeping Relieving factors: movement and medication  PRECAUTIONS: None  WEIGHT BEARING RESTRICTIONS: No  FALLS:  Has patient fallen in last 6 months? Yes. Number of falls 1  LIVING ENVIRONMENT: Lives with: lives with their spouse  Lives in: House/apartment Stairs: Yes: External: 3 steps; on right going up, on left going up, and can reach both Has following equipment at home: Grab bars and inversion table  OCCUPATION: retired  PLOF: Independent  PATIENT GOALS: being able to sleep through the night  OBJECTIVE:   DIAGNOSTIC FINDINGS:  MRI at [place] but unknown results  PATIENT SURVEYS:  FOTO 54  COGNITION: Overall cognitive status: Within functional limits for tasks assessed  SENSATION: WFL  POSTURE: rounded shoulders and forward head  PALPATION: Left upper trap tightness; left cervical spine tightness.    CERVICAL ROM:   Active ROM AROM (deg) eval  Flexion full  Extension 27  Right lateral flexion 38  Left lateral flexion 39  Right rotation 61  Left rotation 70   (Blank rows = not tested)  UPPER EXTREMITY ROM:  Active ROM Right eval Left eval  Shoulder flexion    Shoulder extension    Shoulder abduction    Shoulder adduction    Shoulder extension    Shoulder internal rotation    Shoulder external rotation    Elbow flexion    Elbow extension    Wrist flexion    Wrist extension    Wrist ulnar deviation    Wrist radial deviation    Wrist pronation    Wrist supination     (Blank rows = not tested)  UPPER EXTREMITY MMT:  MMT Right eval Left eval  Shoulder flexion 4+ 5  Shoulder extension    Shoulder abduction 4+ 4+  Shoulder adduction    Shoulder extension    Shoulder internal rotation    Shoulder external rotation    Middle trapezius 5 5  Lower trapezius    Elbow flexion 5 5   Elbow extension 5 5  Wrist flexion    Wrist extension    Wrist ulnar deviation    Wrist radial deviation    Wrist pronation    Wrist supination    Grip strength     (Blank rows = not tested)  CERVICAL SPECIAL TESTS:  Distraction test: Positive  FUNCTIONAL TESTS:  5 times sit to stand: 15 sec   TODAY'S TREATMENT:                                                                                                                      DATE:  06/30/22 Physical therapy evaluation and HEP instruction  PATIENT EDUCATION:  Education details: Patient educated on exam findings, POC, scope of PT, HEP. Person educated: Patient Education method: Explanation, Demonstration, and Handouts Education comprehension: verbalized understanding, returned demonstration, verbal cues required, and tactile cues required  HOME EXERCISE PROGRAM: Access Code: [email or web address] URL: [email or web address] Date: 06/30/2022 Prepared by: AP - Rehab  Exercises - Seated Correct Posture  - 3 x daily - 7 x weekly - 1 sets - 1 reps - Seated Scapular Retraction  - 3 x daily - 7 x weekly - 1 sets - 10 reps - Seated Upper Trapezius Stretch  - 3 x  daily - 7 x weekly - 1 sets - 5 reps - 20 sec hold - Seated Cervical Retraction  - 3 x daily - 7 x weekly - 1 sets - 10 reps  ASSESSMENT:  CLINICAL IMPRESSION: Patient is a 71 y.o. 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 Patient presents on evaluation with  pain, tenderness on palpation, postural impairment, mild deficits with cervical mobility and upper extremity strength all of which negatively impact his ability to sleep and perform upper extremity and overhead tasks. Patient will benefit from continued skilled therapy services to address deficits and promote return to optimal function.       OBJECTIVE IMPAIRMENTS: decreased activity tolerance,  decreased endurance, decreased mobility, difficulty walking, decreased ROM, decreased strength, hypomobility, increased fascial restrictions, impaired perceived functional ability, increased muscle spasms, impaired flexibility, postural dysfunction, and pain.   ACTIVITY LIMITATIONS: carrying, lifting, sitting, standing, sleeping, reach over head, hygiene/grooming, and caring for others  PARTICIPATION LIMITATIONS: meal prep, cleaning, laundry, driving, shopping, community activity, and yard work   [facility] POTENTIAL: Good  CLINICAL DECISION MAKING: Stable/uncomplicated  EVALUATION COMPLEXITY: Low   GOALS: Goals reviewed with patient? No  SHORT TERM GOALS: Target date: 07/14/2022   Patient will be independent with initial HEP  Baseline:  Goal status: INITIAL  2.  Patient will improve cervical extension by 10 degrees to improve ability to look up to scan cabinets in kitchen Baseline: see above Goal status: INITIAL  LONG TERM GOALS: Target date: 07/28/2022  Patient will be independent in self management strategies to improve quality of life and functional outcomes.  Baseline:  Goal status: INITIAL  2.  Patient will improve FOTO score to predicted value  Baseline: 54 Goal status: INITIAL  3. Patient will improve cervical rotation mobility by 20 degrees total to improve ability to scan for driving   Baseline: see above  Goal status: initial  4.   Patient will report at least 50% improvement in overall symptoms and/or function to demonstrate improved functional mobility   Baseline:   Goal status: initial     PLAN:  PT FREQUENCY: 2x/week  PT DURATION: 4 weeks  PLANNED INTERVENTIONS: Therapeutic exercises, Therapeutic activity, Neuromuscular re-education, Balance training, Gait training, Patient/Family education, Joint manipulation, Joint mobilization, Stair training, Orthotic/Fit training, DME instructions, Aquatic Therapy, Dry Needling, Electrical stimulation, Spinal  manipulation, Spinal mobilization, Cryotherapy, Moist heat, Compression bandaging, scar mobilization, Splintting, Taping, Traction, Ultrasound, Ionotophoresis 4mg /ml Dexamethasone, and Manual therapy  PLAN FOR NEXT SESSION: Review HEP and goals, STM to cervical spine as needed, cervical mobility and postural strengthening.    3:28 PM, 06/30/22 Ahaan Zobrist Small Alix Lahmann MPT [place] [facility] [place] [facility] [place] [phone]

## 2022-06-30 ENCOUNTER — Ambulatory Visit (HOSPITAL_COMMUNITY): Payer: No Typology Code available for payment source | Attending: Neurosurgery

## 2022-06-30 DIAGNOSIS — M542 Cervicalgia: Secondary | ICD-10-CM | POA: Diagnosis present

## 2022-06-30 DIAGNOSIS — M5412 Radiculopathy, cervical region: Secondary | ICD-10-CM | POA: Diagnosis present

## 2022-06-30 DIAGNOSIS — G8929 Other chronic pain: Secondary | ICD-10-CM | POA: Insufficient documentation

## 2022-07-07 ENCOUNTER — Ambulatory Visit (HOSPITAL_COMMUNITY): Payer: No Typology Code available for payment source

## 2022-07-07 ENCOUNTER — Encounter (HOSPITAL_COMMUNITY): Payer: Self-pay

## 2022-07-07 DIAGNOSIS — G8929 Other chronic pain: Secondary | ICD-10-CM

## 2022-07-07 DIAGNOSIS — M5412 Radiculopathy, cervical region: Secondary | ICD-10-CM

## 2022-07-07 DIAGNOSIS — M542 Cervicalgia: Secondary | ICD-10-CM | POA: Diagnosis not present

## 2022-07-07 NOTE — Therapy (Signed)
OUTPATIENT PHYSICAL THERAPY CERVICAL TREATMENT   Patient Name: Ian Roberson MRN: [id number] DOB:[birth year], 71 y.o., male Today's Date: 07/07/2022   PT End of Session - 07/07/22 0903     Visit Number 2    Number of Visits 8    Date for PT Re-Evaluation 07/28/22    Authorization Type VA/ veterans administration    Authorization - Number of Visits 15    PT Start Time 0856    PT Stop Time 0942    PT Time Calculation (min) 46 min    Activity Tolerance Patient tolerated treatment well    Behavior During Therapy [facility] for tasks assessed/performed              Past Medical History:  Diagnosis Date   Diabetes mellitus without complication ([facility])    Gout    Hyperlipidemia    Hypertension    Past Surgical History:  Procedure Laterality Date   FOOT SURGERY     KNEE SURGERY     NECK SURGERY     Patient Active Problem List   Diagnosis Date Noted   Acute CVA (cerebrovascular accident) ([facility]) 01/26/2022   Unsteady gait 01/26/2022   Type 2 diabetes mellitus ([facility]) 01/26/2022   Gout 01/26/2022   Essential tremor 01/26/2022   Fever 05/11/2016   Malaise 05/11/2016   Tachypnea 05/11/2016   Essential hypertension    Hyperlipidemia    Sepsis ([facility])     [facility]  REFERRING PROVIDER: Pt Eval And Tx For 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  Does c/o back and Bil knee pain.  Current pain scale  4/10 sharp pain.  Reports he has began HEP when sitting.  Reports radicular symptoms come and goes, notices most when waking at night and early morning.    Eval subjective:  Neck pain for many years; goes down right arm more than left; referred to Dr. Franky Macho by [place]; referred to therapy; sleep is disturbed; pain in his neck wakes him up  PERTINENT  HISTORY:  DM Lyphoma removed 2 years ago right upper trap area  PAIN:  Are you having pain? Yes: NPRS scale: 4/10 Pain location: back of neck and down the arms Pain description: sore and cold Aggravating factors: sleeping Relieving factors: movement and medication  PRECAUTIONS: None  WEIGHT BEARING RESTRICTIONS: No  FALLS:  Has patient fallen in last 6 months? Yes. Number of falls 1  LIVING ENVIRONMENT: Lives with: lives with their spouse Lives in: House/apartment Stairs: Yes: External: 3 steps; on right going up, on left going up, and can reach both Has following equipment at home: Grab bars and inversion table  OCCUPATION: retired  PLOF: [place]: being able to sleep through the night  OBJECTIVE:   DIAGNOSTIC FINDINGS:  MRI at [place] but unknown results  PATIENT SURVEYS:  FOTO 54  COGNITION: Overall cognitive status: Within functional limits for tasks assessed  SENSATION: WFL  POSTURE: rounded shoulders and forward head  PALPATION: Left upper trap tightness; left cervical spine tightness.    CERVICAL ROM:   Active ROM AROM (deg) eval  Flexion full  Extension 27  Right lateral flexion 38  Left lateral flexion 39  Right rotation 61  Left rotation 70   (Blank rows = not tested)  UPPER EXTREMITY ROM:  Active ROM Right eval Left eval  Shoulder flexion    Shoulder extension    Shoulder abduction    Shoulder adduction    Shoulder extension    Shoulder internal rotation    Shoulder external rotation    Elbow flexion    Elbow extension    Wrist flexion    Wrist extension    Wrist ulnar  deviation    Wrist radial deviation    Wrist pronation    Wrist supination     (Blank rows = not tested)  UPPER EXTREMITY MMT:  MMT Right eval Left eval  Shoulder flexion 4+ 5  Shoulder extension    Shoulder abduction 4+ 4+  Shoulder adduction    Shoulder extension    Shoulder internal rotation    Shoulder external rotation    Middle trapezius 5 5  Lower trapezius    Elbow flexion 5 5  Elbow extension 5 5  Wrist flexion    Wrist extension    Wrist ulnar deviation    Wrist radial deviation    Wrist pronation    Wrist supination    Grip strength     (Blank rows = not tested)  CERVICAL SPECIAL TESTS:  Distraction test: Positive  FUNCTIONAL TESTS:  5 times sit to stand: 15 sec   TODAY'S TREATMENT:                                                                                                                      DATE:  07/07/22: Reviewed goals, educated importance of HEP compliance Educated importance of seated posture Cervical retraction 10x5" Scapular retraction Seated posture, shown lumbar support for car UT stretch 2x 30" 3D cervical excursion 5x  each Wback 10x  Manual supine STM to UT, PROM rotation, Cervical traction   06/30/22 Physical therapy evaluation and HEP instruction  PATIENT EDUCATION:  Education details: Patient educated on exam findings, POC, scope of PT, HEP. Person educated: Patient Education method: Explanation, Demonstration, and Handouts Education comprehension: verbalized understanding, returned demonstration, verbal cues required, and tactile cues required  HOME EXERCISE PROGRAM: Access Code: [email or web address] URL: [email or web address] Date: 06/30/2022 Prepared by: AP - Rehab  Exercises - Seated Correct Posture  - 3 x daily - 7 x weekly - 1 sets - 1 reps - Seated Scapular Retraction  - 3 x daily - 7 x weekly - 1 sets - 10 reps - Seated Upper Trapezius Stretch  - 3 x daily - 7 x weekly - 1 sets - 5 reps - 20 sec hold -  Seated Cervical Retraction  - 3 x daily - 7 x weekly - 1 sets - 10 reps  ASSESSMENT:  CLINICAL IMPRESSION: Reviewed goals, educated importance of HEP compliance for maximal benefits, pt able to recall and demonstrate appropriate mechanics.  Session focus with cervical mobility and postural strengthening.  Pt able to complete all exercises with good form following initial instructions.  Discussed seated posture, pt shown lumbar support to assist with seated posture.  EOS with manual STM and manual cervical traction with reports of pain resolved during traction, able to reduce though unable to resolve UT restrictions this session.  Reviewed current HEP to continue at home.   OBJECTIVE IMPAIRMENTS: decreased activity tolerance, decreased endurance, decreased mobility, difficulty walking, decreased ROM, decreased strength, hypomobility, increased fascial restrictions, impaired perceived functional ability, increased muscle spasms, impaired flexibility, postural dysfunction, and pain.   ACTIVITY LIMITATIONS: carrying, lifting, sitting, standing, sleeping, reach over head, hygiene/grooming, and caring for others  PARTICIPATION LIMITATIONS: meal prep, cleaning, laundry, driving, shopping, community activity, and yard work   [facility] POTENTIAL: Good  CLINICAL DECISION MAKING: Stable/uncomplicated  EVALUATION COMPLEXITY: Low   GOALS: Goals reviewed with patient? No  SHORT TERM GOALS: Target date: 07/14/2022   Patient will be independent with initial HEP  Baseline:  Goal status: IN PROGRESS  2.  Patient will improve cervical extension by 10 degrees to improve ability to look up to scan cabinets in kitchen Baseline: see above Goal status: IN PROGRESS  LONG TERM GOALS: Target date: 07/28/2022  Patient will be independent in self management strategies to improve quality of life and functional outcomes.  Baseline:  Goal status: IN PROGRESS  2.  Patient will improve FOTO score to predicted  value  Baseline: 54 Goal status: IN PROGRESS  3. Patient will improve cervical rotation mobility by 20 degrees total to improve ability to scan for driving   Baseline: see above  Goal status: IN PROGRESS  4.   Patient will report at least 50% improvement in overall symptoms and/or function to demonstrate improved functional mobility   Baseline:   Goal status: IN PROGRESS     PLAN:  PT FREQUENCY: 2x/week  PT DURATION: 4 weeks  PLANNED INTERVENTIONS: Therapeutic exercises, Therapeutic activity, Neuromuscular re-education, Balance training, Gait training, Patient/Family education, Joint manipulation, Joint mobilization, Stair training, Orthotic/Fit training, DME instructions, Aquatic Therapy, Dry Needling, Electrical stimulation, Spinal manipulation, Spinal mobilization, Cryotherapy, Moist heat, Compression bandaging, scar mobilization, Splintting, Taping, Traction, Ultrasound, Ionotophoresis 4mg /ml Dexamethasone, and Manual therapy  PLAN FOR NEXT SESSION: STM to cervical spine as needed, cervical mobility and postural strengthening.   , LPTA/CLT; CBIS [phone]  12:45 PM, 07/07/22

## 2022-07-09 ENCOUNTER — Ambulatory Visit (HOSPITAL_COMMUNITY): Payer: No Typology Code available for payment source | Attending: Neurosurgery

## 2022-07-09 ENCOUNTER — Encounter (HOSPITAL_COMMUNITY): Payer: Self-pay

## 2022-07-09 DIAGNOSIS — M542 Cervicalgia: Secondary | ICD-10-CM | POA: Diagnosis present

## 2022-07-09 DIAGNOSIS — M5412 Radiculopathy, cervical region: Secondary | ICD-10-CM | POA: Diagnosis present

## 2022-07-09 DIAGNOSIS — G8929 Other chronic pain: Secondary | ICD-10-CM | POA: Diagnosis present

## 2022-07-09 NOTE — Therapy (Signed)
OUTPATIENT PHYSICAL THERAPY CERVICAL TREATMENT   Patient Name: Ian Roberson MRN: [id number] DOB:25-Dec-1950, 71 y.o., male Today's Date: 07/09/2022   PT End of Session - 07/09/22 0943     Visit Number 3    Number of Visits 8    Date for PT Re-Evaluation 07/28/22    Authorization Type [facility]/ veterans administration    Authorization - Number of Visits 15    PT Start Time 0904    PT Stop Time 0942    PT Time Calculation (min) 38 min    Activity Tolerance Patient tolerated treatment well    Behavior During Therapy [facility] for tasks assessed/performed               Past Medical History:  Diagnosis Date   Diabetes mellitus without complication (HCC)    Gout    Hyperlipidemia    Hypertension    Past Surgical History:  Procedure Laterality Date   FOOT SURGERY     KNEE SURGERY     NECK SURGERY     Patient Active Problem List   Diagnosis Date Noted   Acute CVA (cerebrovascular accident) (HCC) 01/26/2022   Unsteady gait 01/26/2022   Type 2 diabetes mellitus (HCC) 01/26/2022   Gout 01/26/2022   Essential tremor 01/26/2022   Fever 05/11/2016   Malaise 05/11/2016   Tachypnea 05/11/2016   Essential hypertension    Hyperlipidemia    Sepsis (HCC)     [facility]  REFERRING PROVIDER: Pt Eval And Tx For 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  Has began HEP 2-3 times daily and reports increased  awareness of posture.  Has taken lumbar support off his inversion table and uses it his chair.  Eval subjective:  Neck pain for many years; goes down right arm more than left; referred to Dr. Franky Macho by [place]; referred to therapy; sleep is disturbed; pain in his neck wakes him up  PERTINENT HISTORY:  DM Lyphoma removed 2 years ago right upper  trap area  PAIN:  Are you having pain? Yes: NPRS scale: 4/10 Pain location: back of neck and down the arms Pain description: sore and cold Aggravating factors: sleeping Relieving factors: movement and medication  PRECAUTIONS: None  WEIGHT BEARING RESTRICTIONS: No  FALLS:  Has patient fallen in last 6 months? Yes. Number of falls 1  LIVING ENVIRONMENT: Lives with: lives with their spouse Lives in: House/apartment Stairs: Yes: External: 3 steps; on right going up, on left going up, and can reach both Has following equipment at home: Grab bars and inversion table  OCCUPATION: retired  PLOF: [place]: being able to sleep through the night  OBJECTIVE:   DIAGNOSTIC FINDINGS:  MRI at [place] but unknown results  PATIENT SURVEYS:  FOTO 54  COGNITION: Overall cognitive status: Within functional limits for tasks assessed  SENSATION: WFL  POSTURE: rounded shoulders and forward head  PALPATION: Left upper trap tightness; left cervical spine tightness.    CERVICAL ROM:   Active ROM AROM (deg) eval  Flexion full  Extension 27  Right lateral flexion 38  Left lateral flexion 39  Right rotation 61  Left rotation 70   (Blank rows = not tested)  UPPER EXTREMITY ROM:  Active ROM Right eval Left eval  Shoulder flexion    Shoulder extension    Shoulder abduction    Shoulder adduction    Shoulder extension    Shoulder internal rotation    Shoulder external rotation    Elbow flexion    Elbow extension    Wrist flexion    Wrist extension    Wrist ulnar deviation    Wrist radial deviation    Wrist pronation     Wrist supination     (Blank rows = not tested)  UPPER EXTREMITY MMT:  MMT Right eval Left eval  Shoulder flexion 4+ 5  Shoulder extension    Shoulder abduction 4+ 4+  Shoulder adduction    Shoulder extension    Shoulder internal rotation    Shoulder external rotation    Middle trapezius 5 5  Lower trapezius    Elbow flexion 5 5  Elbow extension 5 5  Wrist flexion    Wrist extension    Wrist ulnar deviation    Wrist radial deviation    Wrist pronation    Wrist supination    Grip strength     (Blank rows = not tested)  CERVICAL SPECIAL TESTS:  Distraction test: Positive  FUNCTIONAL TESTS:  5 times sit to stand: 15 sec   TODAY'S TREATMENT:                                                                                                                      DATE:  07/09/22: 3D cervical excursion 10x Cervical retraction 10x5" Standing RTB shoulder extension 15        RTB row 15x Manual STM to cervical mm, UT, levator, manual cervical traction 2x 1', suboccipital release UT stretch 2x 30"  07/07/22: Reviewed goals, educated importance of HEP compliance Educated importance of seated posture Cervical retraction 10x5" Scapular retraction Seated posture, shown lumbar support for car UT stretch 2x 30" 3D cervical excursion 5x each Wback 10x  Manual supine STM to UT, PROM rotation, Cervical traction   06/30/22 Physical therapy evaluation and HEP instruction  PATIENT EDUCATION:  Education details: Patient educated on exam findings, POC, scope of PT, HEP. Person educated: Patient Education method: Explanation, Demonstration, and Handouts Education comprehension: verbalized understanding, returned demonstration, verbal cues required, and tactile cues required  HOME EXERCISE PROGRAM: Access Code: [email or web address] URL: [email or web address] Date: 06/30/2022 Prepared by: AP - Rehab 07/09/22:  theraband shoulder extension and rows  Exercises - Seated  Correct Posture  - 3 x daily - 7 x weekly - 1 sets - 1 reps - Seated Scapular Retraction  - 3 x daily - 7 x weekly - 1 sets - 10 reps - Seated Upper Trapezius Stretch  - 3 x daily - 7 x weekly - 1 sets - 5 reps - 20 sec hold - Seated Cervical Retraction  - 3 x daily - 7 x weekly - 1 sets - 10 reps  ASSESSMENT:  CLINICAL IMPRESSION: Pt progressing well with improved awareness of posture and presents with improved cervical mobility.  Continued session focus with mobility and postural strengthening.  Added theraband resistance for postural strengthening, able to demonstrated good form and mechanics, added to HEP.  EOS with manual STM and manual cervical traction with reports of pain reduced and improved mobility following.  Pt likes the cervical traction as well.   OBJECTIVE IMPAIRMENTS: decreased activity tolerance, decreased endurance, decreased mobility, difficulty walking, decreased ROM, decreased strength, hypomobility, increased fascial restrictions, impaired perceived functional ability, increased muscle spasms, impaired flexibility, postural dysfunction, and pain.   ACTIVITY LIMITATIONS: carrying, lifting, sitting, standing, sleeping, reach over head, hygiene/grooming, and caring for others  PARTICIPATION LIMITATIONS: meal prep, cleaning, laundry, driving, shopping, community activity, and yard work   [facility] POTENTIAL: Good  CLINICAL DECISION MAKING: Stable/uncomplicated  EVALUATION COMPLEXITY: Low   GOALS: Goals reviewed with patient? No  SHORT TERM GOALS: Target date: 07/14/2022   Patient will be independent with initial HEP  Baseline:  Goal status: IN PROGRESS  2.  Patient will improve cervical extension by 10 degrees to improve ability to look up to scan cabinets in kitchen Baseline: see above Goal status: IN PROGRESS  LONG TERM GOALS: Target date: 07/28/2022  Patient will be independent in self management strategies to improve quality of life and functional  outcomes.  Baseline:  Goal status: IN PROGRESS  2.  Patient will improve FOTO score to predicted value  Baseline: 54 Goal status: IN PROGRESS  3. Patient will improve cervical rotation mobility by 20 degrees total to improve ability to scan for driving   Baseline: see above  Goal status: IN PROGRESS  4.   Patient will report at least 50% improvement in overall symptoms and/or function to demonstrate improved functional mobility   Baseline:   Goal status: IN PROGRESS     PLAN:  PT FREQUENCY: 2x/week  PT DURATION: 4 weeks  PLANNED INTERVENTIONS: Therapeutic exercises, Therapeutic activity, Neuromuscular re-education, Balance training, Gait training, Patient/Family education, Joint manipulation, Joint mobilization, Stair training, Orthotic/Fit training, DME instructions, Aquatic Therapy, Dry Needling, Electrical stimulation, Spinal manipulation, Spinal mobilization, Cryotherapy, Moist heat, Compression bandaging, scar mobilization, Splintting, Taping, Traction, Ultrasound, Ionotophoresis 4mg /ml Dexamethasone, and Manual therapy  PLAN FOR NEXT SESSION: STM to cervical spine as needed, cervical mobility and postural  strengthening.  Added pec stretch next session.    Becky Sax, LPTA/CLT; CBIS [phone]  9:46 AM, 07/09/22

## 2022-07-14 ENCOUNTER — Encounter (HOSPITAL_COMMUNITY): Payer: Self-pay | Admitting: Physical Therapy

## 2022-07-14 ENCOUNTER — Ambulatory Visit (HOSPITAL_COMMUNITY): Payer: No Typology Code available for payment source | Admitting: Physical Therapy

## 2022-07-14 DIAGNOSIS — M5412 Radiculopathy, cervical region: Secondary | ICD-10-CM

## 2022-07-14 DIAGNOSIS — G8929 Other chronic pain: Secondary | ICD-10-CM

## 2022-07-14 DIAGNOSIS — M542 Cervicalgia: Secondary | ICD-10-CM

## 2022-07-14 NOTE — Therapy (Signed)
OUTPATIENT PHYSICAL THERAPY CERVICAL TREATMENT   Patient Name: Ian Roberson MRN: [id number] DOB:[birth year], 71 y.o., male Today's Date: 07/14/2022   PT End of Session - 07/14/22 0950     Visit Number 4    Number of Visits 8    Date for PT Re-Evaluation 07/28/22    Authorization Type [facility]/ veterans administration    Authorization - Number of Visits 15    PT Start Time [phone]    PT Stop Time 1028    PT Time Calculation (min) 42 min    Activity Tolerance Patient tolerated treatment well    Behavior During Therapy WFL for tasks assessed/performed               Past Medical History:  Diagnosis Date   Diabetes mellitus without complication ([place])    Gout    Hyperlipidemia    Hypertension    Past Surgical History:  Procedure Laterality Date   FOOT SURGERY     KNEE SURGERY     NECK SURGERY     Patient Active Problem List   Diagnosis Date Noted   Acute CVA (cerebrovascular accident) ([place]) 01/26/2022   Unsteady gait 01/26/2022   Type 2 diabetes mellitus ([place]) 01/26/2022   Gout 01/26/2022   Essential tremor 01/26/2022   Fever 05/11/2016   Malaise 05/11/2016   Tachypnea 05/11/2016   Essential hypertension    Hyperlipidemia    Sepsis ([place])     [facility]  REFERRING PROVIDER: Pt Eval And Tx For 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-per Ashok Pall MD   REFERRING DIAG: Pt Eval And Tx For 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  Eval subjective:  Neck pain for many years; goes down right arm more than left; referred to Dr. Christella Noa by [place]; referred to therapy; sleep is disturbed; pain in his neck wakes him up  PERTINENT HISTORY:  DM Lyphoma removed 2 years ago right upper trap area  PAIN:  Are you having pain? Yes: NPRS scale: 3/10 Pain location: back of neck  and down the arms Pain description: sore and cold Aggravating factors: sleeping Relieving factors: movement and medication  PRECAUTIONS: None  WEIGHT BEARING RESTRICTIONS: No  FALLS:  Has patient fallen in last 6 months? Yes. Number of falls 1  LIVING ENVIRONMENT: Lives with: lives with their spouse Lives in: House/apartment Stairs: Yes: External: 3 steps; on right going up, on left going up, and can reach both Has following equipment at home: Grab bars and inversion table  OCCUPATION: retired  PLOF: Independent  PATIENT GOALS: being able to sleep through the night  OBJECTIVE:   DIAGNOSTIC FINDINGS:  MRI at [place] but unknown results  PATIENT SURVEYS:  FOTO 54  COGNITION: Overall cognitive status: Within functional limits for tasks assessed  SENSATION: WFL  POSTURE: rounded shoulders and forward head  PALPATION: Left upper trap tightness; left cervical spine tightness.    CERVICAL ROM:   Active ROM AROM (deg) eval  Flexion full  Extension 27  Right lateral flexion 38  Left lateral flexion 39  Right rotation 61  Left rotation 70   (Blank rows = not tested)  UPPER EXTREMITY ROM:  Active ROM Right eval Left eval  Shoulder flexion    Shoulder extension    Shoulder abduction    Shoulder adduction    Shoulder extension    Shoulder internal rotation    Shoulder external rotation    Elbow flexion    Elbow extension    Wrist flexion    Wrist extension    Wrist ulnar deviation    Wrist radial deviation    Wrist pronation    Wrist supination     (Blank rows = not tested)  UPPER EXTREMITY MMT:  MMT  Right eval Left eval  Shoulder flexion 4+ 5  Shoulder extension    Shoulder abduction 4+ 4+  Shoulder adduction    Shoulder extension    Shoulder internal rotation    Shoulder external rotation    Middle trapezius 5 5  Lower trapezius    Elbow flexion 5 5  Elbow extension 5 5  Wrist flexion    Wrist extension    Wrist ulnar deviation    Wrist radial deviation    Wrist pronation    Wrist supination    Grip strength     (Blank rows = not tested)  CERVICAL SPECIAL TESTS:  Distraction test: Positive  FUNCTIONAL TESTS:  5 times sit to stand: 15 sec   TODAY'S TREATMENT:                                                                                                                      DATE:  07/14/22 3D cervical excursion 10x Cervical retraction 10x5" Seated thoracic extension x10 UT stretch 2x 30"  Standing RTB shoulder extension 15 RTB shoulder horizontal abduction 15x RTB row 15x RTB BIL ER 15x    07/09/22: 3D cervical excursion 10x Cervical retraction 10x5" Standing RTB shoulder extension 15        RTB row 15x Manual  STM to cervical mm, UT, levator, manual cervical traction 2x 1', suboccipital release UT stretch 2x 30"    07/07/22: Reviewed goals, educated importance of HEP compliance Educated importance of seated posture Cervical retraction 10x5" Scapular retraction Seated posture, shown lumbar support for car UT stretch 2x 30" 3D cervical excursion 5x each Wback 10x  Manual supine STM to UT, PROM rotation, Cervical traction   06/30/22 Physical therapy evaluation and HEP instruction  PATIENT EDUCATION:  Education details: Patient educated on exam findings, POC, scope of PT, HEP. Person educated: Patient Education method: Explanation, Demonstration, and Handouts Education comprehension: verbalized understanding, returned demonstration, verbal cues required, and tactile cues required  HOME EXERCISE PROGRAM: Access Code: [email or web address] URL:  [email or web address] Date: 06/30/2022 Prepared by: AP - Rehab 07/09/22:  theraband shoulder extension and rows  Exercises - Seated Correct Posture  - 3 x daily - 7 x weekly - 1 sets - 1 reps - Seated Scapular Retraction  - 3 x daily - 7 x weekly - 1 sets - 10 reps - Seated Upper Trapezius Stretch  - 3 x daily - 7 x weekly - 1 sets - 5 reps - 20 sec hold - Seated Cervical Retraction  - 3 x daily - 7 x weekly - 1 sets - 10 reps  ASSESSMENT:  CLINICAL IMPRESSION: Tolerated exercises well except attempted lower trap set on wall which pt is unable to perform due to Lt shoulder pain which he reports as chronic. Felt better with adjustments made to upper trap stretch, depressing shoulder rather than pulling head, cues to keep stretch gentle. Patient will continue to benefit from skilled physical therapy services to further improve independence with functional mobility.    OBJECTIVE IMPAIRMENTS: decreased activity tolerance, decreased endurance, decreased mobility, difficulty walking, decreased ROM, decreased strength, hypomobility, increased fascial restrictions, impaired perceived functional ability, increased muscle spasms, impaired flexibility, postural dysfunction, and pain.   ACTIVITY LIMITATIONS: carrying, lifting, sitting, standing, sleeping, reach over head, hygiene/grooming, and caring for others  PARTICIPATION LIMITATIONS: meal prep, cleaning, laundry, driving, shopping, community activity, and yard work   [facility] POTENTIAL: Good  CLINICAL DECISION MAKING: Stable/uncomplicated  EVALUATION COMPLEXITY: Low   GOALS: Goals reviewed with patient? No  SHORT TERM GOALS: Target date: 07/14/2022   Patient will be independent with initial HEP  Baseline:  Goal status: IN PROGRESS  2.  Patient will improve cervical extension by 10 degrees to improve ability to look up to scan cabinets in kitchen Baseline: see above Goal status: IN PROGRESS  LONG TERM GOALS: Target  date: 07/28/2022  Patient will be independent in self management strategies to improve quality of life and functional outcomes.  Baseline:  Goal status: IN PROGRESS  2.  Patient will improve FOTO score to predicted value  Baseline: 54 Goal status: IN PROGRESS  3. Patient will improve cervical rotation mobility by 20 degrees total to improve ability to scan for driving   Baseline: see above  Goal status: IN PROGRESS  4.   Patient will report at least 50% improvement in overall symptoms and/or function to demonstrate improved functional mobility   Baseline:   Goal status: IN PROGRESS     PLAN:  PT FREQUENCY: 2x/week  PT DURATION: 4 weeks  PLANNED INTERVENTIONS: Therapeutic exercises, Therapeutic activity, Neuromuscular re-education, Balance training, Gait training, Patient/Family education, Joint manipulation, Joint mobilization, Stair training, Orthotic/Fit training, DME instructions, Aquatic Therapy, Dry Needling, Electrical stimulation, Spinal manipulation, Spinal mobilization, Cryotherapy, Moist heat, Compression bandaging, scar mobilization, Splintting, Taping, Traction, Ultrasound, Ionotophoresis 4mg /ml Dexamethasone,  and Manual therapy  PLAN FOR NEXT SESSION: STM to cervical spine as needed, cervical mobility and postural strengthening.  Add pec stretch next session.    Kathlyn Sacramento, PT, DPT Physical Therapist [facility] [place] [facility]   10:38 AM, 07/14/22

## 2022-07-16 ENCOUNTER — Ambulatory Visit (HOSPITAL_COMMUNITY): Payer: No Typology Code available for payment source | Admitting: Physical Therapy

## 2022-07-16 ENCOUNTER — Encounter (HOSPITAL_COMMUNITY): Payer: Self-pay | Admitting: Physical Therapy

## 2022-07-16 DIAGNOSIS — G8929 Other chronic pain: Secondary | ICD-10-CM

## 2022-07-16 DIAGNOSIS — M542 Cervicalgia: Secondary | ICD-10-CM | POA: Diagnosis not present

## 2022-07-16 DIAGNOSIS — M5412 Radiculopathy, cervical region: Secondary | ICD-10-CM

## 2022-07-16 NOTE — Therapy (Signed)
OUTPATIENT PHYSICAL THERAPY CERVICAL TREATMENT   Patient Name: Ian Roberson MRN: [id number] DOB:11/23/1950, 71 y.o., male Today's Date: 07/16/2022   PT End of Session - 07/16/22 0816     Visit Number 5    Number of Visits 8    Date for PT Re-Evaluation 07/28/22    Authorization Type [facility]/ [facility]    Authorization - Number of Visits 15    PT Start Time 0815    PT Stop Time 0853    PT Time Calculation (min) 38 min    Activity Tolerance Patient tolerated treatment well    Behavior During Therapy WFL for tasks assessed/performed               Past Medical History:  Diagnosis Date   Diabetes mellitus without complication (HCC)    Gout    Hyperlipidemia    Hypertension    Past Surgical History:  Procedure Laterality Date   FOOT SURGERY     KNEE SURGERY     NECK SURGERY     Patient Active Problem List   Diagnosis Date Noted   Acute CVA (cerebrovascular accident) (HCC) 01/26/2022   Unsteady gait 01/26/2022   Type 2 diabetes mellitus (HCC) 01/26/2022   Gout 01/26/2022   Essential tremor 01/26/2022   Fever 05/11/2016   Malaise 05/11/2016   Tachypnea 05/11/2016   Essential hypertension    Hyperlipidemia    Sepsis (HCC)     [facility]  REFERRING PROVIDER: Pt Eval And Tx For 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 Exercises are going well. Feels that he has some more ROM.  Continued symptoms with sleeping that wake him up during the night. Symptoms radiate into bicep region. Symptoms with sleeping and decreased activity.   Eval subjective:  Neck pain for many years; goes down right arm more than left; referred to Dr. Franky Macho by [place]; referred to therapy; sleep is disturbed; pain in his neck wakes him up  PERTINENT HISTORY:  DM Lyphoma removed 2  years ago right upper trap area  PAIN:  Are you having pain? Yes: NPRS scale: 3/10 Pain location: back of neck and down the arms Pain description: sore and cold Aggravating factors: sleeping Relieving factors: movement and medication  PRECAUTIONS: None  WEIGHT BEARING RESTRICTIONS: No  FALLS:  Has patient fallen in last 6 months? Yes. Number of falls 1  LIVING ENVIRONMENT: Lives with: lives with their spouse Lives in: House/apartment Stairs: Yes: External: 3 steps; on right going up, on left going up, and can reach both Has following equipment at home: Grab bars and inversion table  OCCUPATION: retired  PLOF: Independent  PATIENT GOALS: being able to sleep through the night  OBJECTIVE:   DIAGNOSTIC FINDINGS:  MRI at [place] but unknown results  PATIENT SURVEYS:  FOTO 54  COGNITION: Overall cognitive status: Within functional limits for tasks assessed  SENSATION: WFL  POSTURE: rounded shoulders and forward head  PALPATION: Left upper trap tightness; left cervical spine tightness.    CERVICAL ROM:   Active ROM AROM (deg) eval  Flexion full  Extension 27  Right lateral flexion 38  Left lateral flexion 39  Right rotation 61  Left rotation 70   (Blank rows = not tested)  UPPER EXTREMITY ROM:  Active ROM Right eval Left eval  Shoulder flexion    Shoulder extension    Shoulder abduction    Shoulder adduction    Shoulder extension    Shoulder internal rotation    Shoulder external rotation    Elbow flexion    Elbow extension    Wrist flexion    Wrist extension    Wrist ulnar  deviation    Wrist radial deviation    Wrist pronation    Wrist supination     (Blank rows = not tested)  UPPER EXTREMITY MMT:  MMT Right eval Left eval  Shoulder flexion 4+ 5  Shoulder extension    Shoulder abduction 4+ 4+  Shoulder adduction    Shoulder extension    Shoulder internal rotation    Shoulder external rotation    Middle trapezius 5 5  Lower trapezius    Elbow flexion 5 5  Elbow extension 5 5  Wrist flexion    Wrist extension    Wrist ulnar deviation    Wrist radial deviation    Wrist pronation    Wrist supination    Grip strength     (Blank rows = not tested)  CERVICAL SPECIAL TESTS:  Distraction test: Positive  FUNCTIONAL TESTS:  5 times sit to stand: 15 sec   TODAY'S TREATMENT:                                                                                                                      DATE:  07/16/22 Seated cervical retraction 2x 10  Seated UT stretch 2 x 30 second holds bilateral Pec stretch in doorway 5 x 10 second holds Row GTB 2 x 10  Shoulder extension GTB 2x 10  High row  GTB 2x 10 Scap retraction with GH ER 2 x 10 Thoracic extension over chair 10 x 5 second holds Supine cervical retractions 2x 10   07/14/22 3D cervical excursion 10x Cervical retraction 10x5" Seated thoracic extension x10 UT stretch 2x 30"  Standing RTB shoulder extension 15 RTB shoulder horizontal abduction 15x RTB row 15x RTB BIL ER 15x    07/09/22: 3D cervical excursion 10x Cervical retraction 10x5" Standing RTB shoulder extension 15        RTB row 15x Manual STM to cervical mm, UT, levator, manual cervical traction 2x 1', suboccipital release UT stretch 2x 30"    07/07/22: Reviewed goals, educated importance of HEP compliance Educated importance of seated posture Cervical retraction 10x5" Scapular retraction Seated posture, shown lumbar support for car UT stretch 2x 30" 3D cervical excursion 5x each Wback 10x  Manual supine STM to UT,  PROM rotation, Cervical traction   PATIENT EDUCATION:  Education details: Patient educated on exam findings, POC, scope of PT, HEP. Person educated: Patient Education method: Explanation, Demonstration, and Handouts Education comprehension: verbalized understanding, returned demonstration, verbal cues required, and tactile cues required  HOME EXERCISE PROGRAM: Access Code: [email or web address] URL: [email or web address]  07/16/22 - Standing Shoulder Row with Anchored Resistance  - 1 x daily - 7 x weekly - 2 sets - 10 reps - Shoulder extension with resistance - Neutral  - 1 x daily - 7 x weekly - 2 sets - 10 reps - Shoulder External Rotation and Scapular Retraction with Resistance  - 1 x daily - 7 x weekly - 2 sets - 10 reps - Standing Shoulder Horizontal Abduction with Resistance  - 1 x daily - 7 x weekly - 2 sets - 10 reps - Supine Chin Tuck  - 1 x daily - 7 x weekly - 2-3 sets - 10 reps  07/09/22:  theraband shoulder extension and rows  Date: 06/30/2022 - Seated Correct Posture  - 3 x daily - 7 x weekly - 1 sets - 1 reps - Seated Scapular Retraction  - 3 x daily - 7 x weekly - 1 sets - 10 reps - Seated Upper Trapezius Stretch  - 3 x daily - 7 x weekly - 1 sets - 5 reps - 20 sec hold - Seated Cervical Retraction  - 3 x daily - 7 x weekly - 1 sets - 10 reps  ASSESSMENT:  CLINICAL IMPRESSION: Continued with previously completed exercises with minimal to no cueing required. Patient tolerates additional postural strengthening exercises and is able to perform with increased resistance today. He requires initial cueing for mechanics and posture with good carry over following. Showed and patient able to perform cervical retractions in supine with good mechanics to assess if it helps with cervical symptoms when trying to sleep. Patient will continue to benefit from physical therapy in order to improve function and reduce impairment.     OBJECTIVE IMPAIRMENTS: decreased activity  tolerance, decreased endurance, decreased mobility, difficulty walking, decreased ROM, decreased strength, hypomobility, increased fascial restrictions, impaired perceived functional ability, increased muscle spasms, impaired flexibility, postural dysfunction, and pain.   ACTIVITY LIMITATIONS: carrying, lifting, sitting, standing, sleeping, reach over head, hygiene/grooming, and caring for others  PARTICIPATION LIMITATIONS: meal prep, cleaning, laundry, driving, shopping, community activity, and yard work   [facility] POTENTIAL: Good  CLINICAL DECISION MAKING: Stable/uncomplicated  EVALUATION COMPLEXITY: Low   GOALS: Goals reviewed with patient? No  SHORT TERM GOALS: Target date: 07/14/2022   Patient will be independent with initial HEP  Baseline:  Goal status: IN PROGRESS  2.  Patient will improve cervical extension by 10 degrees to improve ability to look up to scan cabinets in kitchen Baseline: see above Goal status: IN PROGRESS  LONG TERM GOALS: Target date: 07/28/2022  Patient will be independent in self management strategies to improve quality of life and functional outcomes.  Baseline:  Goal status: IN PROGRESS  2.  Patient will improve FOTO score to predicted value  Baseline: 54 Goal status: IN PROGRESS  3. Patient will improve cervical rotation mobility by 20 degrees total to improve ability to scan for driving   Baseline: see above  Goal status: IN PROGRESS  4.   Patient will report at least 50% improvement in overall symptoms and/or function to demonstrate improved functional mobility   Baseline:   Goal status: IN PROGRESS     PLAN:  PT FREQUENCY: 2x/week  PT DURATION: 4 weeks  PLANNED INTERVENTIONS: Therapeutic exercises, Therapeutic activity, Neuromuscular re-education, Balance training, Gait training, Patient/Family education, Joint manipulation, Joint mobilization, Stair training, Orthotic/Fit training, DME instructions, Aquatic Therapy, Dry  Needling, Electrical stimulation, Spinal manipulation, Spinal mobilization, Cryotherapy, Moist heat, Compression bandaging, scar mobilization, Splintting, Taping, Traction, Ultrasound, Ionotophoresis 4mg /ml Dexamethasone, and Manual therapy  PLAN FOR NEXT SESSION: STM to cervical spine as needed, cervical mobility and postural strengthening.   8:17 AM, 07/16/22 13/09/23 PT, DPT Physical Therapist at [facility]

## 2022-07-21 ENCOUNTER — Encounter (HOSPITAL_COMMUNITY): Payer: Self-pay

## 2022-07-21 ENCOUNTER — Ambulatory Visit (HOSPITAL_COMMUNITY): Payer: No Typology Code available for payment source

## 2022-07-21 DIAGNOSIS — M542 Cervicalgia: Secondary | ICD-10-CM

## 2022-07-21 DIAGNOSIS — M5412 Radiculopathy, cervical region: Secondary | ICD-10-CM

## 2022-07-21 DIAGNOSIS — G8929 Other chronic pain: Secondary | ICD-10-CM

## 2022-07-21 NOTE — Therapy (Addendum)
OUTPATIENT PHYSICAL THERAPY CERVICAL TREATMENT   Patient Name: Ian Roberson MRN: [id number] DOB:16-Jul-1951, 71 y.o., male Today's Date: 07/21/2022   PT End of Session - 07/21/22 1041     Visit Number 6    Number of Visits 8    Date for PT Re-Evaluation 07/28/22    Authorization Type [facility]/ [facility] administration    Authorization - Number of Visits 15    PT Start Time [phone]    PT Stop Time 1028    PT Time Calculation (min) 41 min    Activity Tolerance Patient tolerated treatment well    Behavior During Therapy WFL for tasks assessed/performed                Past Medical History:  Diagnosis Date   Diabetes mellitus without complication ([place])    Gout    Hyperlipidemia    Hypertension    Past Surgical History:  Procedure Laterality Date   FOOT SURGERY     KNEE SURGERY     NECK SURGERY     Patient Active Problem List   Diagnosis Date Noted   Acute CVA (cerebrovascular accident) ([place]) 01/26/2022   Unsteady gait 01/26/2022   Type 2 diabetes mellitus ([place]) 01/26/2022   Gout 01/26/2022   Essential tremor 01/26/2022   Fever 05/11/2016   Malaise 05/11/2016   Tachypnea 05/11/2016   Essential hypertension    Hyperlipidemia    Sepsis ([place])     [facility]  REFERRING PROVIDER: Pt Eval And Tx For 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-per Ashok Pall MD   REFERRING DIAG: Pt Eval And Tx For 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  Reports some soreness following the band exercises multiple times a day.  Reports increased ROM with driving at night.     Eval subjective:  Neck pain for many years; goes down right arm more than left; referred to Dr. Christella Noa by [place]; referred to therapy; sleep is disturbed; pain in his neck wakes him up  PERTINENT HISTORY:  DM Lyphoma removed 2  years ago right upper trap area  PAIN:  Are you having pain? Yes: NPRS scale: 3/10 Pain location: posterior neck Pain description: sore Aggravating factors: sleeping Relieving factors: movement and medication  PRECAUTIONS: None  WEIGHT BEARING RESTRICTIONS: No  FALLS:  Has patient fallen in last 6 months? Yes. Number of falls 1  LIVING ENVIRONMENT: Lives with: lives with their spouse Lives in: House/apartment Stairs: Yes: External: 3 steps; on right going up, on left going up, and can reach both Has following equipment at home: Grab bars and inversion table  OCCUPATION: retired  PLOF: Independent  PATIENT GOALS: being able to sleep through the night  OBJECTIVE:   DIAGNOSTIC FINDINGS:  MRI at [place] but unknown results  PATIENT SURVEYS:  FOTO 54  COGNITION: Overall cognitive status: Within functional limits for tasks assessed  SENSATION: WFL  POSTURE: rounded shoulders and forward head  PALPATION: Left upper trap tightness; left cervical spine tightness.    CERVICAL ROM:   Active ROM AROM (deg) eval  Flexion full  Extension 27  Right lateral flexion 38  Left lateral flexion 39  Right rotation 61  Left rotation 70   (Blank rows = not tested)  UPPER EXTREMITY ROM:  Active ROM Right eval Left eval  Shoulder flexion    Shoulder extension    Shoulder abduction    Shoulder adduction    Shoulder extension    Shoulder internal rotation    Shoulder external rotation    Elbow flexion    Elbow extension    Wrist flexion    Wrist extension    Wrist ulnar deviation    Wrist radial deviation    Wrist  pronation    Wrist supination     (Blank rows = not tested)  UPPER EXTREMITY MMT:  MMT Right eval Left eval  Shoulder flexion 4+ 5  Shoulder extension    Shoulder abduction 4+ 4+  Shoulder adduction    Shoulder extension    Shoulder internal rotation    Shoulder external rotation    Middle trapezius 5 5  Lower trapezius    Elbow flexion 5 5  Elbow extension 5 5  Wrist flexion    Wrist extension    Wrist ulnar deviation    Wrist radial deviation    Wrist pronation    Wrist supination    Grip strength     (Blank rows = not tested)  CERVICAL SPECIAL TESTS:  Distraction test: Positive  FUNCTIONAL TESTS:  5 times sit to stand: 15 sec   TODAY'S TREATMENT:                                                                                                                      DATE:  07/21/22 Seated cervical retraction 2x 10 Wback 10x Standing cervical retraction 10x 3" UE flexion with cervical retraction front of door Reviewed form with GTB Shoulder extension 10x Row 10x ER 10x BUE Corner stretch for pecs 3x 30" Seated 3D thoracic excursion 3x each  07/16/22 Seated cervical retraction 2x 10  Seated UT stretch 2 x 30 second holds bilateral Pec stretch in doorway 5 x 10 second holds Row GTB 2 x 10  Shoulder extension GTB 2x 10  High row GTB 2x 10 Scap retraction with GH ER 2 x 10 Thoracic extension over chair 10 x 5 second holds Supine cervical retractions 2x 10   07/14/22 3D cervical excursion 10x Cervical retraction 10x5" Seated thoracic extension x10 UT stretch 2x 30"  Standing RTB shoulder extension 15 RTB shoulder horizontal abduction 15x RTB row 15x RTB BIL ER 15x    07/09/22: 3D cervical excursion 10x Cervical retraction 10x5" Standing RTB shoulder extension 15        RTB row 15x Manual STM to cervical mm, UT, levator, manual cervical traction 2x 1', suboccipital release UT stretch 2x 30"    07/07/22: Reviewed goals, educated importance of  HEP compliance Educated importance of seated posture Cervical retraction 10x5" Scapular retraction Seated posture, shown lumbar support for car UT stretch 2x 30" 3D cervical excursion 5x each Wback 10x  Manual supine STM to UT, PROM rotation, Cervical traction   PATIENT EDUCATION:  Education details: Patient educated on exam findings, POC, scope of PT, HEP. Person educated: Patient Education method: Explanation, Demonstration, and Handouts Education comprehension: verbalized understanding, returned demonstration, verbal cues required, and tactile cues required  HOME EXERCISE PROGRAM: Access Code: [email or web address] URL: [email or web address] 07/21/22: Theraband ER and pec corner stretch 3x 30"   07/16/22 - Standing Shoulder Row with Anchored Resistance  - 1 x daily - 7 x weekly - 2 sets - 10 reps - Shoulder extension with resistance - Neutral  - 1 x daily - 7 x weekly - 2 sets - 10 reps - Shoulder External Rotation and Scapular Retraction with Resistance  - 1 x daily - 7 x weekly - 2 sets - 10 reps - Standing Shoulder Horizontal Abduction with Resistance  - 1 x daily - 7 x weekly - 2 sets - 10 reps - Supine Chin Tuck  - 1 x daily - 7 x weekly - 2-3 sets - 10 reps  07/09/22:  theraband shoulder extension and rows  Date: 06/30/2022 - Seated Correct Posture  - 3 x daily - 7 x weekly - 1 sets - 1 reps - Seated Scapular Retraction  - 3 x daily - 7 x weekly - 1 sets - 10 reps - Seated Upper Trapezius Stretch  - 3 x daily - 7 x weekly - 1 sets - 5 reps - 20 sec hold - Seated Cervical Retraction  - 3 x daily - 7 x weekly - 1 sets - 10 reps  ASSESSMENT:  CLINICAL IMPRESSION: Session focus with postural strengthening and thoracic mobility.  Reviewed form/ mechanics with theraband, educated current HEP for postural/scapular retraction strengthening not UE.  Progressed cervical stability this session with additional standing exercises and added pec stretch to address tightness.  No  reports of increased pain through session.      OBJECTIVE IMPAIRMENTS: decreased activity tolerance, decreased endurance, decreased mobility, difficulty walking, decreased ROM, decreased strength, hypomobility, increased fascial restrictions, impaired perceived functional ability, increased muscle spasms, impaired flexibility, postural dysfunction, and pain.   ACTIVITY LIMITATIONS: carrying, lifting, sitting, standing, sleeping, reach over head, hygiene/grooming, and caring for others  PARTICIPATION LIMITATIONS: meal prep, cleaning, laundry, driving, shopping, community activity, and yard work   [employer] POTENTIAL: Good  CLINICAL DECISION MAKING: Stable/uncomplicated  EVALUATION COMPLEXITY: Low   GOALS: Goals reviewed with patient? No  SHORT TERM GOALS: Target date: 07/14/2022   Patient will be independent with initial HEP  Baseline:  Goal status: IN PROGRESS  2.  Patient will improve cervical extension by 10 degrees to improve ability to look up to scan cabinets in kitchen Baseline: see above Goal status: IN PROGRESS  LONG TERM GOALS: Target date: 07/28/2022  Patient will be independent in self management strategies to improve quality of life and functional outcomes.  Baseline:  Goal status: IN PROGRESS  2.  Patient will improve FOTO score to predicted value  Baseline: 54 Goal status: IN PROGRESS  3. Patient will improve cervical rotation mobility by 20 degrees total to improve ability to scan for driving   Baseline: see above  Goal status: IN PROGRESS  4.   Patient will report at least 50% improvement in overall symptoms and/or function to demonstrate improved functional mobility   Baseline:   Goal status: IN PROGRESS     PLAN:  PT FREQUENCY: 2x/week  PT DURATION: 4 weeks  PLANNED INTERVENTIONS: Therapeutic exercises, Therapeutic activity, Neuromuscular re-education, Balance training, Gait training, Patient/Family education, Joint manipulation, Joint  mobilization, Stair training, Orthotic/Fit training, DME instructions, Aquatic Therapy, Dry Needling, Electrical stimulation, Spinal manipulation, Spinal mobilization, Cryotherapy, Moist heat, Compression bandaging, scar mobilization, Splintting, Taping, Traction, Ultrasound, Ionotophoresis 4mg /ml Dexamethasone, and Manual therapy  PLAN FOR NEXT SESSION: STM to cervical spine as needed, cervical mobility and postural strengthening.    Ihor Austin, LPTA/CLT; CBIS [phone]  10:42 AM, 07/21/22

## 2022-07-21 NOTE — Patient Instructions (Signed)
External Rotation (Eccentric), (Resistance Band)    Quickly pull band outward with forearm of affected arm. Keep elbows at sides, wrists neutral. Slowly return to start for 3-5 seconds. Use green resistance band. Hint: Use towel roll between elbow and hip. 10 reps per set, 2 sets per day, 5 days per week.  http://ecce.exer.us/196   Copyright  VHI. All rights reserved.   Flexibility: Corner Stretch    Standing in corner with hands just above shoulder level and feet ____ inches from corner, lean forward until a comfortable stretch is felt across chest. Hold 30 seconds. Repeat 3 times per set. Do 3 sets per session. Do 2 sessions per day.  http://orth.exer.us/342   Copyright  VHI. All rights reserved.

## 2022-07-23 ENCOUNTER — Ambulatory Visit (HOSPITAL_COMMUNITY): Payer: No Typology Code available for payment source

## 2022-07-23 ENCOUNTER — Encounter (HOSPITAL_COMMUNITY): Payer: Self-pay

## 2022-07-23 DIAGNOSIS — G8929 Other chronic pain: Secondary | ICD-10-CM

## 2022-07-23 DIAGNOSIS — M542 Cervicalgia: Secondary | ICD-10-CM

## 2022-07-23 DIAGNOSIS — M5412 Radiculopathy, cervical region: Secondary | ICD-10-CM

## 2022-07-23 NOTE — Therapy (Signed)
OUTPATIENT PHYSICAL THERAPY CERVICAL TREATMENT   Patient Name: Ian Roberson MRN: [id number] DOB:11/15/1950, 71 y.o., male Today's Date: 07/23/2022   PT End of Session - 07/23/22 0949     Visit Number 7    Number of Visits 8    Date for PT Re-Evaluation 07/28/22    [facility] Type [facility]    Authorization - Number of Visits 15    PT Start Time 0950    PT Stop Time 1030    PT Time Calculation (min) 40 min    Activity Tolerance Patient tolerated treatment well    Behavior During Therapy WFL for tasks assessed/performed                Past Medical History:  Diagnosis Date   Diabetes mellitus without complication (HCC)    Gout    Hyperlipidemia    Hypertension    Past Surgical History:  Procedure Laterality Date   FOOT SURGERY     KNEE SURGERY     NECK SURGERY     Patient Active Problem List   Diagnosis Date Noted   Acute CVA (cerebrovascular accident) (HCC) 01/26/2022   Unsteady gait 01/26/2022   Type 2 diabetes mellitus (HCC) 01/26/2022   Gout 01/26/2022   Essential tremor 01/26/2022   Fever 05/11/2016   Malaise 05/11/2016   Tachypnea 05/11/2016   Essential hypertension    Hyperlipidemia    Sepsis (HCC)     [facility]  REFERRING PROVIDER: Pt Eval And Tx For 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-per Coletta Memos MD   REFERRING DIAG: Pt Eval And Tx For 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  Reports pain with cervical extension no pain at rest.  No issue driving with cervical ROM.    Eval subjective:  Neck pain for many years; goes down right arm more than left; referred to Dr. Franky Macho by [place]; referred to therapy; sleep is disturbed; pain in his neck wakes him up  PERTINENT HISTORY:  DM Lyphoma removed 2 years ago right upper  trap area  PAIN:  Are you having pain? Yes: NPRS scale: 1-2/10 Pain location: posterior neck Pain description: sore Aggravating factors: sleeping Relieving factors: movement and medication  PRECAUTIONS: None  WEIGHT BEARING RESTRICTIONS: No  FALLS:  Has patient fallen in last 6 months? Yes. Number of falls 1  LIVING ENVIRONMENT: Lives with: lives with their spouse Lives in: House/apartment Stairs: Yes: External: 3 steps; on right going up, on left going up, and can reach both Has following equipment at home: Grab bars and inversion table  OCCUPATION: retired  PLOF: Independent  PATIENT GOALS: being able to sleep through the night  OBJECTIVE:   DIAGNOSTIC FINDINGS:  [place] at [facility] but unknown results  PATIENT SURVEYS:  FOTO 54  COGNITION: Overall cognitive status: Within functional limits for tasks assessed  SENSATION: WFL  POSTURE: rounded shoulders and forward head  PALPATION: Left upper trap tightness; left cervical spine tightness.    CERVICAL ROM:   Active ROM AROM (deg) eval  Flexion full  Extension 27  Right lateral flexion 38  Left lateral flexion 39  Right rotation 61  Left rotation 70   (Blank rows = not tested)  UPPER EXTREMITY ROM:  Active ROM Right eval Left eval  Shoulder flexion    Shoulder extension    Shoulder abduction    Shoulder adduction    Shoulder extension    Shoulder internal rotation    Shoulder external rotation    Elbow flexion    Elbow extension    Wrist flexion    Wrist extension    Wrist ulnar deviation    Wrist radial deviation    Wrist pronation    Wrist supination      (Blank rows = not tested)  UPPER EXTREMITY MMT:  MMT Right eval Left eval  Shoulder flexion 4+ 5  Shoulder extension    Shoulder abduction 4+ 4+  Shoulder adduction    Shoulder extension    Shoulder internal rotation    Shoulder external rotation    Middle trapezius 5 5  Lower trapezius    Elbow flexion 5 5  Elbow extension 5 5  Wrist flexion    Wrist extension    Wrist ulnar deviation    Wrist radial deviation    Wrist pronation    Wrist supination    Grip strength     (Blank rows = not tested)  CERVICAL SPECIAL TESTS:  Distraction test: Positive  FUNCTIONAL TESTS:  5 times sit to stand: 15 sec   TODAY'S TREATMENT:                                                                                                                      DATE:  07/23/22 Seated 3D thoracic excursion 3x each 5" holds Wback 3# 10x X to V 10x  Cervical retraction 10x  Standing wall arch  UE flexion with cervical retraction front of door   Supine: Manual suboccipitial release, cervical traction, PROM and STM to UT  07/21/22 Seated cervical retraction 2x 10 Wback 10x Standing cervical retraction 10x 3" UE flexion with cervical retraction front of door Reviewed form with GTB Shoulder extension 10x Row 10x ER 10x BUE Corner stretch for pecs 3x 30" Seated 3D thoracic excursion 3x each  07/16/22 Seated cervical retraction 2x 10  Seated UT stretch 2 x 30 second holds bilateral Pec stretch in doorway 5 x 10 second holds Row GTB 2 x 10  Shoulder extension GTB 2x 10  High row GTB 2x 10 Scap retraction with GH ER 2 x 10 Thoracic extension over chair 10 x 5 second holds Supine cervical retractions 2x 10   07/14/22 3D cervical excursion 10x Cervical retraction 10x5" Seated thoracic extension x10 UT stretch 2x 30"  Standing RTB shoulder extension 15 RTB shoulder horizontal abduction 15x RTB row 15x RTB BIL ER 15x    07/09/22: 3D cervical excursion 10x Cervical  retraction 10x5" Standing RTB shoulder extension 15        RTB row 15x Manual STM to cervical mm, UT, levator, manual cervical traction 2x 1', suboccipital release UT stretch 2x 30"    07/07/22: Reviewed goals, educated importance of HEP compliance Educated importance of seated posture Cervical retraction 10x5" Scapular retraction Seated posture, shown lumbar support for car UT stretch 2x 30" 3D cervical excursion 5x each Wback 10x  Manual supine STM to UT, PROM rotation, Cervical traction   PATIENT EDUCATION:  Education details: Patient educated on exam findings, POC, scope of PT, HEP. Person educated: Patient Education method: Explanation, Demonstration, and Handouts Education comprehension: verbalized understanding, returned demonstration, verbal cues required, and tactile cues required  HOME EXERCISE PROGRAM: Access Code: [email or web address] URL: [email or web address] 07/21/22: Theraband ER and pec corner stretch 3x 30"   07/16/22 - Standing Shoulder Row with Anchored Resistance  - 1 x daily - 7 x weekly - 2 sets - 10 reps - Shoulder extension with resistance - Neutral  - 1 x daily - 7 x weekly - 2 sets - 10 reps - Shoulder External Rotation and Scapular Retraction with Resistance  - 1 x daily - 7 x weekly - 2 sets - 10 reps - Standing Shoulder Horizontal Abduction with Resistance  - 1 x daily - 7 x weekly - 2 sets - 10 reps - Supine Chin Tuck  - 1 x daily - 7 x weekly - 2-3 sets - 10 reps  07/09/22:  theraband shoulder extension and rows  Date: 06/30/2022 - Seated Correct Posture  - 3 x daily - 7 x weekly - 1 sets - 1 reps - Seated Scapular Retraction  - 3 x daily - 7 x weekly - 1 sets - 10 reps - Seated Upper Trapezius Stretch  - 3 x daily - 7 x weekly - 1 sets - 5 reps - 20 sec hold - Seated Cervical Retraction  - 3 x daily - 7 x weekly - 1 sets - 10 reps  ASSESSMENT:  CLINICAL IMPRESSION: Continued session focus with postural strengthening and spinal  mobility.  Pt reports improvements with Rt UE setting items into cabinet, reports increased difficulty with Lt UE feels weak.  Able to add weight with seated postural strengthening exercises with positive results.  Added x to v and wall arch for postural strengthening and Lt shoulder mobility.  EOS of manual STM, suboccipital release and manual traction with reports of increased ease with cervical extension.    OBJECTIVE IMPAIRMENTS: decreased activity tolerance, decreased endurance, decreased mobility, difficulty walking, decreased ROM, decreased strength, hypomobility,  increased fascial restrictions, impaired perceived functional ability, increased muscle spasms, impaired flexibility, postural dysfunction, and pain.   ACTIVITY LIMITATIONS: carrying, lifting, sitting, standing, sleeping, reach over head, hygiene/grooming, and caring for others  PARTICIPATION LIMITATIONS: meal prep, cleaning, laundry, driving, shopping, community activity, and yard work   [facility] POTENTIAL: Good  CLINICAL DECISION MAKING: Stable/uncomplicated  EVALUATION COMPLEXITY: Low   GOALS: Goals reviewed with patient? No  SHORT TERM GOALS: Target date: 07/14/2022   Patient will be independent with initial HEP  Baseline:  Goal status: IN PROGRESS  2.  Patient will improve cervical extension by 10 degrees to improve ability to look up to scan cabinets in kitchen Baseline: see above Goal status: IN PROGRESS  LONG TERM GOALS: Target date: 07/28/2022  Patient will be independent in self management strategies to improve quality of life and functional outcomes.  Baseline:  Goal status: IN PROGRESS  2.  Patient will improve FOTO score to predicted value  Baseline: 54 Goal status: IN PROGRESS  3. Patient will improve cervical rotation mobility by 20 degrees total to improve ability to scan for driving   Baseline: see above  Goal status: IN PROGRESS  4.   Patient will report at least 50% improvement in  overall symptoms and/or function to demonstrate improved functional mobility   Baseline:   Goal status: IN PROGRESS     PLAN:  PT FREQUENCY: 2x/week  PT DURATION: 4 weeks  PLANNED INTERVENTIONS: Therapeutic exercises, Therapeutic activity, Neuromuscular re-education, Balance training, Gait training, Patient/Family education, Joint manipulation, Joint mobilization, Stair training, Orthotic/Fit training, DME instructions, Aquatic Therapy, Dry Needling, Electrical stimulation, Spinal manipulation, Spinal mobilization, Cryotherapy, Moist heat, Compression bandaging, scar mobilization, Splintting, Taping, Traction, Ultrasound, Ionotophoresis 4mg /ml Dexamethasone, and Manual therapy  PLAN FOR NEXT SESSION: Review goals next session.     , LPTA/CLT; CBIS [phone]  10:43 AM, 07/23/22

## 2022-07-28 ENCOUNTER — Encounter (HOSPITAL_COMMUNITY): Payer: Non-veteran care

## 2022-08-04 ENCOUNTER — Encounter (HOSPITAL_COMMUNITY): Payer: Non-veteran care

## 2022-10-28 ENCOUNTER — Emergency Department (HOSPITAL_COMMUNITY)
Admission: EM | Admit: 2022-10-28 | Discharge: 2022-10-28 | Disposition: A | Discharge status: Home / Self Care | Payer: No Typology Code available for payment source | Attending: Emergency Medicine | Admitting: Emergency Medicine

## 2022-10-28 ENCOUNTER — Other Ambulatory Visit: Payer: Self-pay

## 2022-10-28 ENCOUNTER — Encounter (HOSPITAL_COMMUNITY): Payer: Self-pay | Admitting: Emergency Medicine

## 2022-10-28 ENCOUNTER — Emergency Department (HOSPITAL_COMMUNITY): Payer: No Typology Code available for payment source

## 2022-10-28 DIAGNOSIS — Z1152 Encounter for screening for COVID-19: Secondary | ICD-10-CM | POA: Insufficient documentation

## 2022-10-28 DIAGNOSIS — M799 Soft tissue disorder, unspecified: Secondary | ICD-10-CM | POA: Insufficient documentation

## 2022-10-28 DIAGNOSIS — J069 Acute upper respiratory infection, unspecified: Secondary | ICD-10-CM | POA: Diagnosis not present

## 2022-10-28 DIAGNOSIS — Z7982 Long term (current) use of aspirin: Secondary | ICD-10-CM | POA: Insufficient documentation

## 2022-10-28 DIAGNOSIS — R059 Cough, unspecified: Secondary | ICD-10-CM | POA: Diagnosis present

## 2022-10-28 LAB — GROUP A STREP BY PCR: Group A Strep by PCR: NOT DETECTED

## 2022-10-28 LAB — RESP PANEL BY RT-PCR (RSV, FLU A&B, COVID)  RVPGX2
Influenza A by PCR: NEGATIVE
Influenza B by PCR: NEGATIVE
Resp Syncytial Virus by PCR: NEGATIVE
SARS Coronavirus 2 by RT PCR: NEGATIVE

## 2022-10-28 MED ORDER — CYCLOBENZAPRINE HCL 10 MG PO TABS
5.0000 mg | ORAL_TABLET | Freq: Once | ORAL | Status: AC
  Administered 2022-10-28: 5 mg via ORAL
  Filled 2022-10-28: qty 1

## 2022-10-28 MED ORDER — KETOROLAC TROMETHAMINE 15 MG/ML IJ SOLN
15.0000 mg | Freq: Once | INTRAMUSCULAR | Status: DC
  Filled 2022-10-28: qty 1

## 2022-10-28 MED ORDER — KETOROLAC TROMETHAMINE 60 MG/2ML IM SOLN
15.0000 mg | Freq: Once | INTRAMUSCULAR | Status: AC
  Administered 2022-10-28: 15 mg via INTRAMUSCULAR

## 2022-10-28 MED ORDER — CYCLOBENZAPRINE HCL 10 MG PO TABS
10.0000 mg | ORAL_TABLET | Freq: Two times a day (BID) | ORAL | 0 refills | Status: DC | PRN
Start: 2022-10-28 — End: 2023-01-12

## 2022-10-28 MED ORDER — MELOXICAM 7.5 MG PO TABS: 7.5000 mg | ORAL_TABLET | Freq: Every day | ORAL | 0 refills | Status: DC

## 2022-10-28 NOTE — ED Provider Notes (Signed)
[facility] Provider Note   CSN: [id number] Arrival date & time: 10/28/22  0040     History  Chief Complaint  Patient presents with   URI    Ian Roberson is a 72 y.o. male.  72 year old male who presents the ER today secondary to right leg pain.  Patient states that he has had upper respiratory infection for the last few days has been coughing quite a bit today cough cough particularly hard.  States that afterwards she had pain in his right groin that radiated down his inner thigh.  Every time he coughs it hurts no swelling there.  No bowel changes.  No other associated symptoms.  He presents for this complaint but also states he had a Runny nose  chills along with his cough.   URI      Home Medications Prior to Admission medications   Medication Sig Start Date End Date Taking? Authorizing Provider  cyclobenzaprine (FLEXERIL) 10 MG tablet Take 1 tablet (10 mg total) by mouth 2 (two) times daily as needed for muscle spasms. 10/28/22  Yes Dalma Panchal, Corene Cornea, MD  meloxicam (MOBIC) 7.5 MG tablet Take 1 tablet (7.5 mg total) by mouth daily. 10/28/22  Yes Braxxton Stoudt, Corene Cornea, MD  albuterol (VENTOLIN HFA) 108 (90 Base) MCG/ACT inhaler Inhale 1-2 puffs into the lungs every 4 (four) hours as needed for wheezing or shortness of breath. 12/11/20   [provider]  allopurinol (ZYLOPRIM) 100 MG tablet Take 100 mg by mouth daily.    [provider]  amLODipine (NORVASC) 10 MG tablet Take 10 mg by mouth daily.    [provider]  aspirin EC 81 MG tablet Take 1 tablet (81 mg total) by mouth daily. Swallow whole. 01/27/22   Manuella Ghazi, Pratik D, DO  atorvastatin (LIPITOR) 40 MG tablet Take 1 tablet (40 mg total) by mouth daily at 6 PM. 01/27/22 03/28/22  Manuella Ghazi, Pratik D, DO  azelastine (ASTELIN) 0.1 % nasal spray Place 1 spray into both nostrils 2 (two) times daily. Use in each nostril as directed Patient taking differently: Place 1 spray into  both nostrils daily as needed for allergies. Use in each nostril as directed 01/09/21   Wurst, [place], PA-C  fluticasone (FLONASE) 50 MCG/ACT nasal spray Place 1 spray into both nostrils daily. Patient taking differently: Place 1 spray into both nostrils daily as needed for allergies. 10/28/21   Horton, Barbette Hair, MD  metFORMIN (GLUCOPHAGE) 500 MG tablet Take 500 mg by mouth in the morning and at bedtime. 12/17/21   [provider]  sodium chloride (OCEAN) 0.65 % SOLN nasal spray Place 1 spray into both nostrils as needed for congestion. 10/28/21   Horton, Barbette Hair, MD  traMADol (ULTRAM) 50 MG tablet Take 50 mg by mouth every 6 (six) hours as needed for moderate pain. 06/17/21   [provider]      Allergies    Penicillins and Erythromycin    Review of Systems   Review of Systems  Physical Exam Updated Vital Signs BP (!) 151/99 (BP Location: Right Arm)   Pulse (!) 108   Temp 98.7 F (37.1 C) (Oral)   Resp 16   Ht 6' 2"$  (1.88 m)   Wt 104.3 kg   SpO2 95%   BMI 29.53 kg/m  Physical Exam Vitals and nursing note reviewed.  Constitutional:      Appearance: He is well-developed.  HENT:     Head: Normocephalic and atraumatic.  Eyes:  Pupils: Pupils are equal, round, and reactive to light.  Cardiovascular:     Rate and Rhythm: Normal rate.  Pulmonary:     Effort: Pulmonary effort is normal. No respiratory distress.  Abdominal:     General: Abdomen is flat. There is no distension.  Musculoskeletal:     Cervical back: Normal range of motion.     Comments: Pain with flexion of his right hip and also with adduction of his right hip.  No pain with other movements.  Skin:    General: Skin is warm and dry.  Neurological:     General: No focal deficit present.     Mental Status: He is alert.     ED Results / Procedures / Treatments   Labs (all labs ordered are listed, but only abnormal results are displayed) Labs Reviewed  GROUP A STREP BY PCR  RESP PANEL  BY RT-PCR (RSV, FLU A&B, COVID)  [id number]    EKG None  Radiology DG Chest 2 View  Result Date: 10/28/2022 CLINICAL DATA:  Productive cough for several days, initial encounter EXAM: CHEST - 2 VIEW COMPARISON:  02/31/2023 FINDINGS: The heart size and mediastinal contours are within normal limits. Both lungs are clear. The visualized skeletal structures are unremarkable. IMPRESSION: No active cardiopulmonary disease. Electronically Signed   By: Inez Catalina M.D.   On: 10/28/2022 01:53    Procedures Procedures    Medications Ordered in ED Medications  cyclobenzaprine (FLEXERIL) tablet 5 mg (5 mg Oral Given by Other 10/28/22 0324)  ketorolac (TORADOL) injection 15 mg (15 mg Intramuscular Given 10/28/22 0334)    ED Course/ Medical Decision Making/ A&P                             Medical Decision Making Amount and/or Complexity of Data Reviewed Radiology: ordered.  Risk Prescription drug management.   Likely viral URI no indication for antibiotics.  I suspect he has a musculoligamentous tenderness strain of his right groin secondary to the coughing.  Improved with Toradol and Flexeril.  Will prescribe the same at home.  Stable for discharge.  Low suspicion for testicular or GI pathology.  No indication for imaging at this time. Able to stand ambulate at baseline gait.   Final Clinical Impression(s) / ED Diagnoses Final diagnoses:  Upper respiratory tract infection, unspecified type  Musculoskeletal disorder    Rx / DC Orders ED Discharge Orders          Ordered    meloxicam (MOBIC) 7.5 MG tablet  Daily        10/28/22 0355    cyclobenzaprine (FLEXERIL) 10 MG tablet  2 times daily PRN        10/28/22 0355              [name], Corene Cornea, MD 10/28/22 2323

## 2022-10-28 NOTE — ED Triage Notes (Signed)
Pt with c/o productive cough (yellow sputum) and sore throat since Sunday. States he has pain that "shoot up Rt leg from knee to groin" when he coughs or sneezes.

## 2023-01-12 ENCOUNTER — Encounter: Payer: Self-pay | Admitting: Emergency Medicine

## 2023-01-12 ENCOUNTER — Ambulatory Visit
Admission: EM | Admit: 2023-01-12 | Discharge: 2023-01-12 | Disposition: A | Discharge status: Home / Self Care | Payer: No Typology Code available for payment source | Attending: Nurse Practitioner | Admitting: Nurse Practitioner

## 2023-01-12 DIAGNOSIS — G8929 Other chronic pain: Secondary | ICD-10-CM

## 2023-01-12 DIAGNOSIS — M546 Pain in thoracic spine: Secondary | ICD-10-CM | POA: Diagnosis not present

## 2023-01-12 MED ORDER — CELECOXIB 100 MG PO CAPS: 100.0000 mg | ORAL_CAPSULE | Freq: Two times a day (BID) | ORAL | 0 refills | Status: AC

## 2023-01-12 MED ORDER — CYCLOBENZAPRINE HCL 5 MG PO TABS: 5.0000 mg | ORAL_TABLET | Freq: Two times a day (BID) | ORAL | 0 refills | Status: AC | PRN

## 2023-01-12 NOTE — ED Triage Notes (Signed)
Cutting grass on Monday and felt a pain right lower back area.  States had to pull cord on mower and that's when he felt the pain.

## 2023-01-12 NOTE — ED Provider Notes (Signed)
[facility]    CSN: [id number] Arrival date & time: 01/12/23  0816      History   Chief Complaint No chief complaint on file.   HPI Ian Roberson is a 72 y.o. male.   The history is provided by the patient.   The patient presents for complaints of mid right back pain that started 1 day ago when he attempted to cut his grass.  He states he pulled a cord on his lawn more and that is when he developed pain in the right side of his back.  Patient states that he feels that the pain goes across his back diagonally.  He states that pain worsens with moving.  He describes the pain as "sharp".  Patient reports that he has a history of back pain as he suffered an injury on the job.  He states his symptoms feel similar to that injury.  Patient reports that he has been on Celebrex and Flexeril in the past, which has worked well for him.  He states he is scheduled to see his doctor at the [place] at the end of this month.  Patient denies fever, chills, chest pain, shortness of breath, difficulty breathing, abdominal pain, loss of bowel or bladder function, lower extremity weakness, or a change in numbness or tingling in his lower extremities.    Past Medical History:  Diagnosis Date   Diabetes mellitus without complication (HCC)    Gout    Hyperlipidemia    Hypertension     Patient Active Problem List   Diagnosis Date Noted   Acute CVA (cerebrovascular accident) (HCC) 01/26/2022   Unsteady gait 01/26/2022   Type 2 diabetes mellitus (HCC) 01/26/2022   Gout 01/26/2022   Essential tremor 01/26/2022   Fever 05/11/2016   Malaise 05/11/2016   Tachypnea 05/11/2016   Essential hypertension    Hyperlipidemia    Sepsis (HCC)     Past Surgical History:  Procedure Laterality Date   FOOT SURGERY     KNEE SURGERY     NECK SURGERY         Home Medications    Prior to Admission medications   Medication Sig Start Date End Date Taking? Authorizing Provider  albuterol (VENTOLIN HFA)  108 (90 Base) MCG/ACT inhaler Inhale 1-2 puffs into the lungs every 4 (four) hours as needed for wheezing or shortness of breath. 12/11/20   [provider]  allopurinol (ZYLOPRIM) 100 MG tablet Take 100 mg by mouth daily.    [provider]  amLODipine (NORVASC) 10 MG tablet Take 10 mg by mouth daily.    [provider]  aspirin EC 81 MG tablet Take 1 tablet (81 mg total) by mouth daily. Swallow whole. 01/27/22   Sherryll Burger, Pratik D, DO  atorvastatin (LIPITOR) 40 MG tablet Take 1 tablet (40 mg total) by mouth daily at 6 PM. 01/27/22 03/28/22  Sherryll Burger, Pratik D, DO  azelastine (ASTELIN) 0.1 % nasal spray Place 1 spray into both nostrils 2 (two) times daily. Use in each nostril as directed Patient taking differently: Place 1 spray into both nostrils daily as needed for allergies. Use in each nostril as directed 01/09/21   Wurst, [place], PA-C  celecoxib (CELEBREX) 100 MG capsule Take 1 capsule (100 mg total) by mouth 2 (two) times daily for 15 days. 01/12/23 01/27/23 Yes Darragh Nay-Warren, Sadie Haber, NP  cyclobenzaprine (FLEXERIL) 5 MG tablet Take 1 tablet (5 mg total) by mouth 2 (two) times daily as needed for muscle spasms. 01/12/23  Yes Darrol Brandenburg-Warren, Sadie Haber, NP  fluticasone (FLONASE) 50 MCG/ACT nasal spray Place 1 spray into both nostrils daily. Patient taking differently: Place 1 spray into both nostrils daily as needed for allergies. 10/28/21   Horton, Mayer Masker, MD  metFORMIN (GLUCOPHAGE) 500 MG tablet Take 500 mg by mouth in the morning and at bedtime. 12/17/21   [provider]  sodium chloride (OCEAN) 0.65 % SOLN nasal spray Place 1 spray into both nostrils as needed for congestion. 10/28/21   Horton, Mayer Masker, MD    Family History Family History  Problem Relation Age of Onset   Heart attack Father    Stroke Mother     Social History Social History   Tobacco Use   Smoking status: Former    Types: Cigarettes   Smokeless tobacco: Never  Vaping Use   Vaping Use:  Never used  Substance Use Topics   Alcohol use: Yes    Comment: weekends   Drug use: No     Allergies   Penicillins and Erythromycin   Review of Systems Review of Systems Per HPI  Physical Exam Triage Vital Signs ED Triage Vitals  Enc Vitals Group     BP 01/12/23 0821 (!) 159/97     Pulse Rate 01/12/23 0821 (!) 120     Resp 01/12/23 0821 20     Temp 01/12/23 0821 98.4 F (36.9 C)     Temp Source 01/12/23 0821 Oral     SpO2 01/12/23 0821 98 %     Weight --      Height --      Head Circumference --      Peak Flow --      Pain Score 01/12/23 0824 7     Pain Loc --      Pain Edu? --      Excl. in GC? --    No data found.  Updated Vital Signs BP (!) 159/97 (BP Location: Right Arm)   Pulse (!) 120   Temp 98.4 F (36.9 C) (Oral)   Resp 20   SpO2 98%   Visual Acuity Right Eye Distance:   Left Eye Distance:   Bilateral Distance:    Right Eye Near:   Left Eye Near:    Bilateral Near:     Physical Exam Vitals and nursing note reviewed.  Constitutional:      General: He is not in acute distress.    Appearance: Normal appearance.  HENT:     Head: Normocephalic.  Eyes:     Extraocular Movements: Extraocular movements intact.     Pupils: Pupils are equal, round, and reactive to light.  Cardiovascular:     Rate and Rhythm: Regular rhythm. Tachycardia present.     Pulses: Normal pulses.     Heart sounds: Normal heart sounds.  Pulmonary:     Effort: Pulmonary effort is normal. No respiratory distress.     Breath sounds: Normal breath sounds. No stridor. No wheezing, rhonchi or rales.  Abdominal:     General: Bowel sounds are normal.     Palpations: Abdomen is soft.     Tenderness: There is no abdominal tenderness.  Musculoskeletal:     Cervical back: Normal range of motion.     Thoracic back: Spasms and tenderness (Tenderness noted from T7- T11) present. No swelling, deformity or lacerations. Decreased range of motion.  Skin:    General: Skin is warm and  dry.  Neurological:     General: No focal deficit present.  Mental Status: He is alert and oriented to person, place, and time.  Psychiatric:        Mood and Affect: Mood normal.        Behavior: Behavior normal.      UC Treatments / Results  Labs (all labs ordered are listed, but only abnormal results are displayed) Labs Reviewed - No data to display  EKG   Radiology No results found.  Procedures Procedures (including critical care time)  Medications Ordered in UC Medications - No data to display  Initial Impression / Assessment and Plan / UC Course  I have reviewed the triage vital signs and the nursing notes.  Pertinent labs & imaging results that were available during my care of the patient were reviewed by me and considered in my medical decision making (see chart for details). The patient is well-appearing, he is in no acute distress, vital signs are stable.  Patient presents with complaints of thoracic spine pain.  Patient with previous history of back pain.  Symptoms started 1 day ago when he attempted to pull the cord on his lawnmower.  On exam, patient has tenderness from T7-T11, with spasm.  No red flag symptoms noted on exam as patient denies chest pain, shortness of breath, difficulty breathing, or a change in numbness or tingling in his lower extremities.  Patient has done well with Celebrex and Flexeril.  Will treat with Celebrex 100 mg twice daily and Flexeril 5 mg twice daily.  Reviewed patient's most recent lab work with the date of May 2023, creatinine was 0.81 at that time.  Supportive care recommendations were provided and discussed with the patient to include use of arthritis strength's, use of as needed, and gentle stretching and range of motion exercises.  Patient was given strict ER follow-up precautions.  Patient to keep scheduled appointment with his doctor at the [place] at the end of this month.  Patient is in agreement with this plan of care and  verbalizes understanding.  All questions were answered.  Patient stable for discharge.   Final Clinical Impressions(s) / UC Diagnoses   Final diagnoses:  Chronic right-sided thoracic back pain     Discharge Instructions      Take medication as prescribed. May take over-the-counter Tylenol arthritis strength 650 mg tablets every 8 hours as needed for pain or discomfort. Recommend the use of heat to help with spasm and stiffness.  Apply for 20 minutes, remove for 1 hour and repeat as needed. Try to remain active, performing stretching and gentle range of motion exercises as tolerated. If you develop numbness or tingling that is different, loss of bowel or bladder function, numbness or tingling in your upper extremities, or other concerns, please go to the emergency department immediately for further evaluation. Please keep scheduled appointment with your physician at the [facility] at the end of this month. Follow-up as needed.     ED Prescriptions     Medication Sig Dispense Auth. Provider   celecoxib (CELEBREX) 100 MG capsule Take 1 capsule (100 mg total) by mouth 2 (two) times daily for 15 days. 30 capsule Kahlie Deutscher-Warren, Sadie Haber, NP   cyclobenzaprine (FLEXERIL) 5 MG tablet Take 1 tablet (5 mg total) by mouth 2 (two) times daily as needed for muscle spasms. 20 tablet Bill Mcvey-Warren, Sadie Haber, NP      PDMP not reviewed this encounter.   Abran Cantor, NP 01/12/23 [phone]

## 2023-01-12 NOTE — Discharge Instructions (Signed)
Take medication as prescribed. May take over-the-counter Tylenol arthritis strength 650 mg tablets every 8 hours as needed for pain or discomfort. Recommend the use of heat to help with spasm and stiffness.  Apply for 20 minutes, remove for 1 hour and repeat as needed. Try to remain active, performing stretching and gentle range of motion exercises as tolerated. If you develop numbness or tingling that is different, loss of bowel or bladder function, numbness or tingling in your upper extremities, or other concerns, please go to the emergency department immediately for further evaluation. Please keep scheduled appointment with your physician at the [facility] at the end of this month. Follow-up as needed.

## 2023-10-19 ENCOUNTER — Ambulatory Visit: Payer: No Typology Code available for payment source

## 2024-05-11 ENCOUNTER — Encounter (HOSPITAL_COMMUNITY): Payer: Self-pay | Admitting: Emergency Medicine

## 2024-05-11 ENCOUNTER — Emergency Department (HOSPITAL_COMMUNITY)
Admission: EM | Admit: 2024-05-11 | Discharge: 2024-05-12 | Disposition: A | Discharge status: Home / Self Care | Attending: Emergency Medicine | Admitting: Emergency Medicine

## 2024-05-11 DIAGNOSIS — R1319 Other dysphagia: Secondary | ICD-10-CM

## 2024-05-11 DIAGNOSIS — Z7984 Long term (current) use of oral hypoglycemic drugs: Secondary | ICD-10-CM | POA: Insufficient documentation

## 2024-05-11 DIAGNOSIS — K219 Gastro-esophageal reflux disease without esophagitis: Secondary | ICD-10-CM | POA: Diagnosis not present

## 2024-05-11 DIAGNOSIS — I10 Essential (primary) hypertension: Secondary | ICD-10-CM | POA: Insufficient documentation

## 2024-05-11 DIAGNOSIS — T18108A Unspecified foreign body in esophagus causing other injury, initial encounter: Secondary | ICD-10-CM | POA: Diagnosis not present

## 2024-05-11 DIAGNOSIS — E119 Type 2 diabetes mellitus without complications: Secondary | ICD-10-CM | POA: Insufficient documentation

## 2024-05-11 DIAGNOSIS — R1310 Dysphagia, unspecified: Secondary | ICD-10-CM | POA: Diagnosis present

## 2024-05-11 DIAGNOSIS — Z87891 Personal history of nicotine dependence: Secondary | ICD-10-CM | POA: Diagnosis not present

## 2024-05-11 DIAGNOSIS — K222 Esophageal obstruction: Secondary | ICD-10-CM | POA: Insufficient documentation

## 2024-05-11 DIAGNOSIS — T18128A Food in esophagus causing other injury, initial encounter: Secondary | ICD-10-CM | POA: Diagnosis not present

## 2024-05-11 DIAGNOSIS — W44F3XA Food entering into or through a natural orifice, initial encounter: Secondary | ICD-10-CM | POA: Diagnosis not present

## 2024-05-11 MED ORDER — DIAZEPAM 5 MG/ML IJ SOLN
5.0000 mg | Freq: Once | INTRAMUSCULAR | Status: AC
  Administered 2024-05-12: 5 mg via INTRAVENOUS
  Filled 2024-05-11: qty 2

## 2024-05-11 MED ORDER — GLUCAGON HCL RDNA (DIAGNOSTIC) 1 MG IJ SOLR
1.0000 mg | Freq: Once | INTRAMUSCULAR | Status: AC
  Administered 2024-05-12: 1 mg via INTRAVENOUS
  Filled 2024-05-11: qty 1

## 2024-05-11 NOTE — ED Provider Notes (Signed)
 [facility] AT [facility] Provider Note   CSN: [id number] Arrival date & time: 05/11/24  2235     Patient presents with: Dysphagia   Ova Meegan is a 73 y.o. male.  {Add pertinent medical, surgical, social history, OB history to YEP:67052} Patient is a 73 year old male with past medical history of esophageal stricture.  Patient presenting today with complaints of food stuck in his esophagus.  He reports eating [place] at approximately 6 PM and feels as though it got stuck in his esophagus.  He is unable to eat or drink.  He denies any difficulty breathing.  No fevers or chills.  He has had esophageal dilation in the past, once by Dr. Shaaron, then repeat in the [place] system.       Prior to Admission medications   Medication Sig Start Date End Date Taking? Authorizing Provider  albuterol  (VENTOLIN  HFA) 108 (90 Base) MCG/ACT inhaler Inhale 1-2 puffs into the lungs every 4 (four) hours as needed for wheezing or shortness of breath. 12/11/20   [provider]  allopurinol  (ZYLOPRIM ) 100 MG tablet Take 100 mg by mouth daily.    [provider]  amLODipine  (NORVASC ) 10 MG tablet Take 10 mg by mouth daily.    [provider]  aspirin  EC 81 MG tablet Take 1 tablet (81 mg total) by mouth daily. Swallow whole. 01/27/22   Maree, Pratik D, DO  atorvastatin  (LIPITOR) 40 MG tablet Take 1 tablet (40 mg total) by mouth daily at 6 PM. 01/27/22 03/28/22  Maree, Pratik D, DO  azelastine  (ASTELIN ) 0.1 % nasal spray Place 1 spray into both nostrils 2 (two) times daily. Use in each nostril as directed Patient taking differently: Place 1 spray into both nostrils daily as needed for allergies. Use in each nostril as directed 01/09/21   Wurst, [place], PA-C  cyclobenzaprine  (FLEXERIL ) 5 MG tablet Take 1 tablet (5 mg total) by mouth 2 (two) times daily as needed for muscle spasms. 01/12/23   Leath-Warren, Etta PARAS, NP  fluticasone  (FLONASE ) 50 MCG/ACT nasal spray  Place 1 spray into both nostrils daily. Patient taking differently: Place 1 spray into both nostrils daily as needed for allergies. 10/28/21   Horton, Charmaine FALCON, MD  metFORMIN (GLUCOPHAGE) 500 MG tablet Take 500 mg by mouth in the morning and at bedtime. 12/17/21   [provider]  sodium chloride  (OCEAN) 0.65 % SOLN nasal spray Place 1 spray into both nostrils as needed for congestion. 10/28/21   Horton, Charmaine FALCON, MD    Allergies: Penicillins and Erythromycin    Review of Systems  All other systems reviewed and are negative.   Updated Vital Signs BP (!) 155/87 (BP Location: Right Arm)   Pulse (!) 43   Temp 99.1 F (37.3 C) (Oral)   Resp (!) 24   Ht 6' 2 (1.88 m)   Wt 98 kg   SpO2 98%   BMI 27.73 kg/m   Physical Exam Vitals and nursing note reviewed.  Constitutional:      General: He is not in acute distress.    Appearance: He is well-developed. He is not diaphoretic.  HENT:     Head: Normocephalic and atraumatic.  Cardiovascular:     Rate and Rhythm: Normal rate and regular rhythm.     Heart sounds: No murmur heard.    No friction rub.  Pulmonary:     Effort: Pulmonary effort is normal. No respiratory distress.     Breath sounds: Normal breath  sounds. No wheezing or rales.  Abdominal:     General: Bowel sounds are normal. There is no distension.     Palpations: Abdomen is soft.     Tenderness: There is no abdominal tenderness.  Musculoskeletal:        General: Normal range of motion.     Cervical back: Normal range of motion and neck supple.  Skin:    General: Skin is warm and dry.  Neurological:     Mental Status: He is alert and oriented to person, place, and time.     Coordination: Coordination normal.     (all labs ordered are listed, but only abnormal results are displayed) Labs Reviewed  BASIC METABOLIC PANEL WITH GFR  CBC WITH DIFFERENTIAL/PLATELET    EKG: None  Radiology: No results found.  {Document cardiac monitor, telemetry  assessment procedure when appropriate:32947} Procedures   Medications Ordered in the ED  glucagon  (human recombinant) (GLUCAGEN ) injection 1 mg (has no administration in time range)  diazepam  (VALIUM ) injection 5 mg (has no administration in time range)      {Click here for ABCD2, HEART and other calculators REFRESH Note before signing:1}                              Medical Decision Making Amount and/or Complexity of Data Reviewed Labs: ordered.  Risk Prescription drug management.   ***  {Document critical care time when appropriate  Document review of labs and clinical decision tools ie CHADS2VASC2, etc  Document your independent review of radiology images and any outside records  Document your discussion with family members, caretakers and with consultants  Document social determinants of health affecting pt's care  Document your decision making why or why not admission, treatments were needed:32947:::1}   Final diagnoses:  None    ED Discharge Orders     None

## 2024-05-11 NOTE — ED Triage Notes (Signed)
 Pt arrives via POV with c/o difficulty swallowing after feeling like a piece of [place] got stuck in esophagus. Pt reports he is supposed to get an esophageal dilation q18 months but has not gotten it done in over 2 years. Pt is able to speak full sentences and airway intact at this time.

## 2024-05-12 ENCOUNTER — Emergency Department (HOSPITAL_COMMUNITY): Admitting: Certified Registered Nurse Anesthetist

## 2024-05-12 ENCOUNTER — Other Ambulatory Visit: Payer: Self-pay

## 2024-05-12 ENCOUNTER — Encounter (HOSPITAL_COMMUNITY): Payer: Self-pay | Admitting: Emergency Medicine

## 2024-05-12 ENCOUNTER — Telehealth: Payer: Self-pay | Admitting: Internal Medicine

## 2024-05-12 ENCOUNTER — Encounter (HOSPITAL_COMMUNITY)
Admission: EM | Disposition: A | Discharge status: Home / Self Care | Payer: Self-pay | Source: Home / Self Care | Attending: Emergency Medicine

## 2024-05-12 DIAGNOSIS — Z87891 Personal history of nicotine dependence: Secondary | ICD-10-CM

## 2024-05-12 DIAGNOSIS — I10 Essential (primary) hypertension: Secondary | ICD-10-CM

## 2024-05-12 DIAGNOSIS — W44F3XA Food entering into or through a natural orifice, initial encounter: Secondary | ICD-10-CM

## 2024-05-12 DIAGNOSIS — K222 Esophageal obstruction: Secondary | ICD-10-CM | POA: Diagnosis not present

## 2024-05-12 DIAGNOSIS — T18108A Unspecified foreign body in esophagus causing other injury, initial encounter: Secondary | ICD-10-CM

## 2024-05-12 DIAGNOSIS — T18128A Food in esophagus causing other injury, initial encounter: Secondary | ICD-10-CM | POA: Diagnosis not present

## 2024-05-12 DIAGNOSIS — R1319 Other dysphagia: Secondary | ICD-10-CM

## 2024-05-12 DIAGNOSIS — R1310 Dysphagia, unspecified: Secondary | ICD-10-CM

## 2024-05-12 DIAGNOSIS — K219 Gastro-esophageal reflux disease without esophagitis: Secondary | ICD-10-CM

## 2024-05-12 HISTORY — PX: ESOPHAGOGASTRODUODENOSCOPY: SHX5428

## 2024-05-12 HISTORY — PX: ESOPHAGEAL DILATION: SHX303

## 2024-05-12 LAB — CBC WITH DIFFERENTIAL/PLATELET
Abs Immature Granulocytes: 0.02 K/uL (ref 0.00–0.07)
Basophils Absolute: 0.1 K/uL (ref 0.0–0.1)
Basophils Relative: 1 %
Eosinophils Absolute: 0.1 K/uL (ref 0.0–0.5)
Eosinophils Relative: 2 %
HCT: 38.9 % — ABNORMAL LOW (ref 39.0–52.0)
Hemoglobin: 13 g/dL (ref 13.0–17.0)
Immature Granulocytes: 0 %
Lymphocytes Relative: 24 %
Lymphs Abs: 1.4 K/uL (ref 0.7–4.0)
MCH: 29.8 pg (ref 26.0–34.0)
MCHC: 33.4 g/dL (ref 30.0–36.0)
MCV: 89.2 fL (ref 80.0–100.0)
Monocytes Absolute: 0.5 K/uL (ref 0.1–1.0)
Monocytes Relative: 9 %
Neutro Abs: 3.8 K/uL (ref 1.7–7.7)
Neutrophils Relative %: 64 %
Platelets: 185 K/uL (ref 150–400)
RBC: 4.36 MIL/uL (ref 4.22–5.81)
RDW: 13 % (ref 11.5–15.5)
WBC: 5.9 K/uL (ref 4.0–10.5)
nRBC: 0 % (ref 0.0–0.2)

## 2024-05-12 LAB — BASIC METABOLIC PANEL WITH GFR
Anion gap: 14 (ref 5–15)
BUN: 15 mg/dL (ref 8–23)
CO2: 20 mmol/L — ABNORMAL LOW (ref 22–32)
Calcium: 9.4 mg/dL (ref 8.9–10.3)
Chloride: 108 mmol/L (ref 98–111)
Creatinine, Ser: 0.96 mg/dL (ref 0.61–1.24)
GFR, Estimated: >60 mL/min (ref >60)
Glucose, Bld: 138 mg/dL — ABNORMAL HIGH (ref 70–99)
Potassium: 3.3 mmol/L — ABNORMAL LOW (ref 3.5–5.1)
Sodium: 142 mmol/L (ref 135–145)

## 2024-05-12 LAB — GLUCOSE, CAPILLARY
Glucose-Capillary: 81 mg/dL (ref 70–99)
Glucose-Capillary: 93 mg/dL (ref 70–99)

## 2024-05-12 SURGERY — EGD (ESOPHAGOGASTRODUODENOSCOPY)
Anesthesia: General

## 2024-05-12 MED ORDER — LACTATED RINGERS IV SOLN: INTRAVENOUS | Status: DC | PRN

## 2024-05-12 MED ORDER — PANTOPRAZOLE SODIUM 40 MG PO TBEC: 40.0000 mg | DELAYED_RELEASE_TABLET | Freq: Every day | ORAL | 11 refills | Status: AC

## 2024-05-12 MED ORDER — PROPOFOL 10 MG/ML IV BOLUS
INTRAVENOUS | Status: DC | PRN
  Administered 2024-05-12: 100 mg via INTRAVENOUS
  Administered 2024-05-12: 50 mg via INTRAVENOUS

## 2024-05-12 MED ORDER — LIDOCAINE 2% (20 MG/ML) 5 ML SYRINGE
INTRAMUSCULAR | Status: DC | PRN
  Administered 2024-05-12: 60 mg via INTRAVENOUS

## 2024-05-12 MED ORDER — SODIUM CHLORIDE 0.9 % IV SOLN: INTRAVENOUS | Status: DC

## 2024-05-12 NOTE — Anesthesia Preprocedure Evaluation (Addendum)
 Anesthesia Evaluation  Patient identified by MRN, date of birth, ID band Patient awake    Reviewed: Allergy & Precautions, NPO status , Patient's Chart, lab work & pertinent test results  Airway Mallampati: II  TM Distance: >3 FB Neck ROM: Full    Dental  (+) Lower Dentures, Upper Dentures   Pulmonary former smoker   Pulmonary exam normal        Cardiovascular hypertension, Pt. on medications Normal cardiovascular exam     Neuro/Psych    GI/Hepatic negative GI ROS, Neg liver ROS,,,  Endo/Other  diabetes, Well Controlled, Type 2, Oral Hypoglycemic Agents    Renal/GU negative Renal ROS     Musculoskeletal   Abdominal   Peds  Hematology   Anesthesia Other Findings   Reproductive/Obstetrics                              Anesthesia Physical Anesthesia Plan  ASA: 3  Anesthesia Plan: General   Post-op Pain Management:    Induction: Intravenous  PONV Risk Score and Plan: Propofol  infusion  Airway Management Planned: Nasal Cannula and Natural Airway  Additional Equipment:   Intra-op Plan:   Post-operative Plan:   Informed Consent: I have reviewed the patients History and Physical, chart, labs and discussed the procedure including the risks, benefits and alternatives for the proposed anesthesia with the patient or authorized representative who has indicated his/her understanding and acceptance.     Dental advisory given  Plan Discussed with: Anesthesiologist  Anesthesia Plan Comments:          Anesthesia Quick Evaluation

## 2024-05-12 NOTE — Transfer of Care (Signed)
 Immediate Anesthesia Transfer of Care Note  Patient: Ian Roberson  Procedure(s) Performed: EGD (ESOPHAGOGASTRODUODENOSCOPY) DILATION, ESOPHAGUS  Patient Location: PACU  Anesthesia Type:General  Level of Consciousness: drowsy  Airway & Oxygen Therapy: Patient Spontanous Breathing  Post-op Assessment: Report given to RN and Post -op Vital signs reviewed and stable  Post vital signs: Reviewed and stable  Last Vitals:  Vitals Value Taken Time  BP 118/65   Temp 98   Pulse 74 05/12/24 11:09  Resp 25 05/12/24 11:09  SpO2 95 % 05/12/24 11:09  Vitals shown include unfiled device data.  Last Pain:  Vitals:   05/12/24 1058  TempSrc:   PainSc: 5          Complications: No notable events documented.

## 2024-05-12 NOTE — Anesthesia Postprocedure Evaluation (Signed)
 Anesthesia Post Note  Patient: Ian Roberson  Procedure(s) Performed: EGD (ESOPHAGOGASTRODUODENOSCOPY) DILATION, ESOPHAGUS  Patient location during evaluation: Phase II Anesthesia Type: General Level of consciousness: awake and alert Pain management: pain level controlled Vital Signs Assessment: post-procedure vital signs reviewed and stable Respiratory status: spontaneous breathing, nonlabored ventilation and respiratory function stable Cardiovascular status: blood pressure returned to baseline and stable Postop Assessment: no apparent nausea or vomiting Anesthetic complications: no   There were no known notable events for this encounter.   Last Vitals:  Vitals:   05/12/24 1117 05/12/24 1124  BP:  126/73  Pulse: 75 75  Resp: (!) 24 (!) 22  Temp: 36.6 C   SpO2: 96% 99%    Last Pain:  Vitals:   05/12/24 1124  TempSrc:   PainSc: 0-No pain                 Quanta Robertshaw L Taline Nass

## 2024-05-12 NOTE — ED Notes (Signed)
 Coke given to pt 30 min after meds per MD instruction

## 2024-05-12 NOTE — Op Note (Signed)
 [facility] Patient Name: Ian Roberson Procedure Date: 05/12/2024 10:45 AM MRN: [id number] Date of Birth: [birth year] Attending MD: Carlin POUR. Cindie , [place], [id number] CSN: [id number] Age: 73 Admit Type: Inpatient Procedure:                Upper GI endoscopy Indications:              Dysphagia, Foreign body in the esophagus, Stenosis                            of the esophagus Providers:                Carlin POUR. Cindie, DO, Jessica Boudreaux, Emilee                            Tubb RN, RN Referring MD:              Medicines:                See the Anesthesia note for documentation of the                            administered medications Complications:            No immediate complications. Estimated Blood Loss:     Estimated blood loss was minimal. Procedure:                Pre-Anesthesia Assessment:                           - The anesthesia plan was to use monitored                            anesthesia care (MAC).                           After obtaining informed consent, the endoscope was                            passed under direct vision. Throughout the                            procedure, the patient's blood pressure, pulse, and                            oxygen saturations were monitored continuously. The                            [id number] ([id number]) Upper was introduced through                            the mouth, and advanced to the second part of                            duodenum. The upper GI endoscopy was accomplished                            without difficulty. The patient  tolerated the                            procedure well. Scope In: 11:03:22 AM Scope Out: 11:06:04 AM Total Procedure Duration: 0 hours 2 minutes 42 seconds  Findings:      There is no endoscopic evidence of foreign body/food in the entire       esophagus.      One benign-appearing, intrinsic moderate stenosis was found in the       distal esophagus. This stenosis measured 1 cm  (in length). The stenosis       was traversed with endoscope which caused mild mucosal disruption.      The entire examined stomach was normal.      The duodenal bulb, first portion of the duodenum and second portion of       the duodenum were normal. Impression:               - Benign-appearing esophageal stenosis.                           - Normal stomach.                           - Normal duodenal bulb, first portion of the                            duodenum and second portion of the duodenum.                           - No specimens collected. Moderate Sedation:      Per Anesthesia Care Recommendation:           - Patient has a contact number available for                            emergencies. The signs and symptoms of potential                            delayed complications were discussed with the                            patient. Return to normal activities tomorrow.                            Written discharge instructions were provided to the                            patient.                           - Mechanical soft diet.                           - Use Protonix  (pantoprazole ) 40 mg PO daily.                           - Repeat upper endoscopy in 4 weeks for esophageal  dilation.                           - No ibuprofen , naproxen, or other non-steroidal                            anti-inflammatory drugs. Procedure Code(s):        --- Professional ---                           [phone], Esophagogastroduodenoscopy, flexible,                            transoral; diagnostic, including collection of                            specimen(s) by brushing or washing, when performed                            (separate procedure) Diagnosis Code(s):        --- Professional ---                           K22.2, Esophageal obstruction                           R13.10, Dysphagia, unspecified                           T18.108A, Unspecified foreign body in  esophagus                            causing other injury, initial encounter CPT copyright 2022 [employer]. All rights reserved. The codes documented in this report are preliminary and upon coder review may  be revised to meet current compliance requirements. Carlin POUR. Cindie, DO Carlin POUR. Madisyn Mawhinney, DO 05/12/2024 11:10:04 AM This report has been signed electronically. Number of Addenda: 0

## 2024-05-12 NOTE — ED Notes (Signed)
 Pt stated that his lower abdomen feels better but feels like something is now stuck in his throat. Family member in the room stated that every time he took a sip he had to spit up. MD made aware.

## 2024-05-12 NOTE — Consult Note (Signed)
 Consulting  Provider: Dr. Geroldine Primary Care Physician:  [facility] Primary Gastroenterologist: [facility] gastroenterology  Reason for Consultation: Esophageal food impaction  HPI:  Ian Roberson is a 73 y.o. male with a past medical history of chronic GERD, esophageal stricture, previous upper endoscopies with esophageal dilation, who presented to [facility] early this morning with feeling of food stuck in his esophagus.  Reports eating [place] approximately 1800 yesterday evening and felt like it got stuck in his esophagus.  Unable to tolerate liquids.  Denies any chest pain.  Patient given IV glucagon  and Valium  followed by carbonated soda without relief in his symptoms.  Blood work in the ER unremarkable.  Past Medical History:  Diagnosis Date   Diabetes mellitus without complication (HCC)    Gout    Hyperlipidemia    Hypertension     Past Surgical History:  Procedure Laterality Date   FOOT SURGERY     KNEE SURGERY     NECK SURGERY      Prior to Admission medications   Medication Sig Start Date End Date Taking? Authorizing Provider  albuterol  (VENTOLIN  HFA) 108 (90 Base) MCG/ACT inhaler Inhale 1-2 puffs into the lungs every 4 (four) hours as needed for wheezing or shortness of breath. 12/11/20   [provider]  allopurinol  (ZYLOPRIM ) 100 MG tablet Take 100 mg by mouth daily.    [provider]  amLODipine  (NORVASC ) 10 MG tablet Take 10 mg by mouth daily.    [provider]  aspirin  EC 81 MG tablet Take 1 tablet (81 mg total) by mouth daily. Swallow whole. 01/27/22   Maree, Pratik D, DO  atorvastatin  (LIPITOR) 40 MG tablet Take 1 tablet (40 mg total) by mouth daily at 6 PM. 01/27/22 03/28/22  Maree, Pratik D, DO  azelastine  (ASTELIN ) 0.1 % nasal spray Place 1 spray into both nostrils 2 (two) times daily. Use in each nostril as directed Patient taking differently: Place 1 spray into both nostrils daily as needed for allergies. Use in each  nostril as directed 01/09/21   Wurst, [place], PA-C  cyclobenzaprine  (FLEXERIL ) 5 MG tablet Take 1 tablet (5 mg total) by mouth 2 (two) times daily as needed for muscle spasms. 01/12/23   Leath-Warren, Etta PARAS, NP  fluticasone  (FLONASE ) 50 MCG/ACT nasal spray Place 1 spray into both nostrils daily. Patient taking differently: Place 1 spray into both nostrils daily as needed for allergies. 10/28/21   Horton, Charmaine FALCON, MD  metFORMIN (GLUCOPHAGE) 500 MG tablet Take 500 mg by mouth in the morning and at bedtime. 12/17/21   [provider]  sodium chloride  (OCEAN) 0.65 % SOLN nasal spray Place 1 spray into both nostrils as needed for congestion. 10/28/21   Horton, Charmaine FALCON, MD    No current facility-administered medications for this encounter.   Current Outpatient Medications  Medication Sig Dispense Refill   albuterol  (VENTOLIN  HFA) 108 (90 Base) MCG/ACT inhaler Inhale 1-2 puffs into the lungs every 4 (four) hours as needed for wheezing or shortness of breath.     allopurinol  (ZYLOPRIM ) 100 MG tablet Take 100 mg by mouth daily.     amLODipine  (NORVASC ) 10 MG tablet Take 10 mg by mouth daily.     aspirin  EC 81 MG tablet Take 1 tablet (81 mg total) by mouth daily. Swallow whole. 30 tablet 12   atorvastatin  (LIPITOR) 40 MG tablet Take 1 tablet (40 mg total) by mouth daily at 6 PM. 30 tablet 1   azelastine  (ASTELIN ) 0.1 %  nasal spray Place 1 spray into both nostrils 2 (two) times daily. Use in each nostril as directed (Patient taking differently: Place 1 spray into both nostrils daily as needed for allergies. Use in each nostril as directed) 30 mL 12   cyclobenzaprine  (FLEXERIL ) 5 MG tablet Take 1 tablet (5 mg total) by mouth 2 (two) times daily as needed for muscle spasms. 20 tablet 0   fluticasone  (FLONASE ) 50 MCG/ACT nasal spray Place 1 spray into both nostrils daily. (Patient taking differently: Place 1 spray into both nostrils daily as needed for allergies.) 16 g 2   metFORMIN (GLUCOPHAGE)  500 MG tablet Take 500 mg by mouth in the morning and at bedtime.     sodium chloride  (OCEAN) 0.65 % SOLN nasal spray Place 1 spray into both nostrils as needed for congestion. 480 mL 0    Allergies as of 05/11/2024 - Review Complete 05/11/2024  Allergen Reaction Noted   Penicillins Other (See Comments) 02/14/2012   Erythromycin Itching 03/19/2006    Family History  Problem Relation Age of Onset   Heart attack Father    Stroke Mother     Social History   Socioeconomic History   Marital status: Married    Spouse name: Not on file   Number of children: Not on file   Years of education: Not on file   Highest education level: Not on file  Occupational History   Not on file  Tobacco Use   Smoking status: Former    Types: Cigarettes   Smokeless tobacco: Never  Vaping Use   Vaping status: Never Used  Substance and Sexual Activity   Alcohol use: Yes    Comment: weekends   Drug use: No   Sexual activity: Never    Birth control/protection: None  Other Topics Concern   Not on file  Social History Narrative   Not on file   Social Drivers of Health   Financial Resource Strain: Not on file  Food Insecurity: Not on file  Transportation Needs: Not on file  Physical Activity: Not on file  Stress: Not on file  Social Connections: Not on file  Intimate Partner Violence: Not on file    Review of Systems: General: Negative for anorexia, weight loss, fever, chills, fatigue, weakness. Eyes: Negative for vision changes.  ENT: Negative for hoarseness, difficulty swallowing , nasal congestion. CV: Negative for chest pain, angina, palpitations, dyspnea on exertion, peripheral edema.  Respiratory: Negative for dyspnea at rest, dyspnea on exertion, cough, sputum, wheezing.  GI: See history of present illness. GU:  Negative for dysuria, hematuria, urinary incontinence, urinary frequency, nocturnal urination.  MS: Negative for joint pain, low back pain.  Derm: Negative for rash or  itching.  Neuro: Negative for weakness, abnormal sensation, seizure, frequent headaches, memory loss, confusion.  Psych: Negative for anxiety, depression Endo: Negative for unusual weight change.  Heme: Negative for bruising or bleeding. Allergy: Negative for rash or hives.  Physical Exam: Vital signs in last 24 hours: Temp:  [98.5 F (36.9 C)-99.1 F (37.3 C)] 98.5 F (36.9 C) (09/05 0518) Pulse Rate:  [43-81] 81 (09/05 0518) Resp:  [18-24] 18 (09/05 0518) BP: (133-155)/(75-87) 133/75 (09/05 0518) SpO2:  [98 %] 98 % (09/05 0518) Weight:  [98 kg] 98 kg (09/04 2243)   General:   Alert,  Well-developed, well-nourished, pleasant and cooperative in NAD Head:  Normocephalic and atraumatic. Eyes:  Sclera clear, no icterus.   Conjunctiva pink. Ears:  Normal auditory acuity. Nose:  No deformity, discharge,  or lesions. Mouth:  No deformity or lesions, dentition normal. Neck:  Supple; no masses or thyromegaly. Lungs:  Clear throughout to auscultation.   No wheezes, crackles, or rhonchi. No acute distress. Heart:  Regular rate and rhythm; no murmurs, clicks, rubs,  or gallops. Abdomen:  Soft, nontender and nondistended. No masses, hepatosplenomegaly or hernias noted. Normal bowel sounds, without guarding, and without rebound.   Msk:  Symmetrical without gross deformities. Normal posture. Pulses:  Normal pulses noted. Extremities:  Without clubbing or edema. Neurologic:  Alert and  oriented x4;  grossly normal neurologically. Skin:  Intact without significant lesions or rashes. Cervical Nodes:  No significant cervical adenopathy. Psych:  Alert and cooperative. Normal mood and affect.  Intake/Output from previous day: No intake/output data recorded. Intake/Output this shift: No intake/output data recorded.  Lab Results: Recent Labs    05/12/24 0043  WBC 5.9  HGB 13.0  HCT 38.9*  PLT 185   BMET Recent Labs    05/12/24 0043  NA 142  K 3.3*  CL 108  CO2 20*  GLUCOSE 138*   BUN 15  CREATININE 0.96  CALCIUM  9.4   LFT No results for input(s): PROT, ALBUMIN, AST, ALT, ALKPHOS, BILITOT, BILIDIR, IBILI in the last 72 hours. PT/INR No results for input(s): LABPROT, INR in the last 72 hours. Hepatitis Panel No results for input(s): HEPBSAG, HCVAB, HEPAIGM, HEPBIGM in the last 72 hours. C-Diff No results for input(s): CDIFFTOX in the last 72 hours.  Studies/Results: No results found.  Assessment: *Esophageal food impaction *Dysphagia *Chronic GERD  Plan: Will proceed with urgent endoscopy for food impaction removal.  Possible esophageal dilation depending on findings.   The risks including infection, bleed, or perforation as well as benefits, limitations, alternatives and imponderables have been reviewed with the patient. Potential for esophageal dilation, biopsy, etc. have also been reviewed.  Questions have been answered. All parties agreeable.  Keep patient NPO.   Carlin POUR. Cindie, D.O. Gastroenterology and Hepatology [facility]    LOS: 0 days     05/12/2024, 10:17 AM

## 2024-05-12 NOTE — Discharge Instructions (Addendum)
 EGD Discharge instructions Please read the instructions outlined below and refer to this sheet in the next few weeks. These discharge instructions provide you with general information on caring for yourself after you leave the hospital. Your doctor may also give you specific instructions. While your treatment has been planned according to the most current medical practices available, unavoidable complications occasionally occur. If you have any problems or questions after discharge, please call your doctor. ACTIVITY You may resume your regular activity but move at a slower pace for the next 24 hours.  Take frequent rest periods for the next 24 hours.  Walking will help expel (get rid of) the air and reduce the bloated feeling in your abdomen.  No driving for 24 hours (because of the anesthesia (medicine) used during the test).  You may shower.  Do not sign any important legal documents or operate any machinery for 24 hours (because of the anesthesia used during the test).  NUTRITION Drink plenty of fluids.  You may resume your normal diet.  Begin with a light meal and progress to your normal diet.  Avoid alcoholic beverages for 24 hours or as instructed by your caregiver.  MEDICATIONS You may resume your normal medications unless your caregiver tells you otherwise.  WHAT YOU CAN EXPECT TODAY You may experience abdominal discomfort such as a feeling of fullness or "gas" pains.  FOLLOW-UP Your doctor will discuss the results of your test with you.  SEEK IMMEDIATE MEDICAL ATTENTION IF ANY OF THE FOLLOWING OCCUR: Excessive nausea (feeling sick to your stomach) and/or vomiting.  Severe abdominal pain and distention (swelling).  Trouble swallowing.  Temperature over 101 F (37.8 C).  Rectal bleeding or vomiting of blood.   Your upper endoscopy revealed the food bolus had passed into your stomach.  You have a significant stricture at the end portion of your esophagus.  By me passing the  endoscope through this region I dilated it today.  Stomach and small bowel were normal.  I am going to start you on pantoprazole  40 mg daily.  Recommend repeat upper endoscopy in 3 to 4 weeks with esophageal dilation.    Recommend you avoid tough textures. All meats should be chopped finely. Eat slowly, take small bites, chew thoroughly, and drink plenty of liquids throughout meals.  I hope you have a great rest of your week!  Carlin POUR. Cindie, D.O. Gastroenterology and Hepatology [facility]

## 2024-05-12 NOTE — Telephone Encounter (Signed)
 Please schedule this patient for upper endoscopy with esophageal dilation in 3 to 4 weeks.  Diagnosis esophageal stricture, dysphagia.  ASA 3.  Thank you

## 2024-05-12 NOTE — Interval H&P Note (Signed)
 History and Physical Interval Note:  05/12/2024 10:30 AM  Ian Roberson  has presented today for surgery, with the diagnosis of esophageal food impaction.  The various methods of treatment have been discussed with the patient and family. After consideration of risks, benefits and other options for treatment, the patient has consented to  Procedure(s): EGD (ESOPHAGOGASTRODUODENOSCOPY) (N/A) DILATION, ESOPHAGUS (N/A) as a surgical intervention.  The patient's history has been reviewed, patient examined, no change in status, stable for surgery.  I have reviewed the patient's chart and labs.  Questions were answered to the patient's satisfaction.     [name]

## 2024-05-12 NOTE — H&P (View-Only) (Signed)
 Consulting  Provider: Dr. Geroldine Primary Care Physician:  [facility] Primary Gastroenterologist: [facility] gastroenterology  Reason for Consultation: Esophageal food impaction  HPI:  Ian Roberson is a 73 y.o. male with a past medical history of chronic GERD, esophageal stricture, previous upper endoscopies with esophageal dilation, who presented to [facility] early this morning with feeling of food stuck in his esophagus.  Reports eating [place] approximately 1800 yesterday evening and felt like it got stuck in his esophagus.  Unable to tolerate liquids.  Denies any chest pain.  Patient given IV glucagon  and Valium  followed by carbonated soda without relief in his symptoms.  Blood work in the ER unremarkable.  Past Medical History:  Diagnosis Date   Diabetes mellitus without complication (HCC)    Gout    Hyperlipidemia    Hypertension     Past Surgical History:  Procedure Laterality Date   FOOT SURGERY     KNEE SURGERY     NECK SURGERY      Prior to Admission medications   Medication Sig Start Date End Date Taking? Authorizing Provider  albuterol  (VENTOLIN  HFA) 108 (90 Base) MCG/ACT inhaler Inhale 1-2 puffs into the lungs every 4 (four) hours as needed for wheezing or shortness of breath. 12/11/20   [provider]  allopurinol  (ZYLOPRIM ) 100 MG tablet Take 100 mg by mouth daily.    [provider]  amLODipine  (NORVASC ) 10 MG tablet Take 10 mg by mouth daily.    [provider]  aspirin  EC 81 MG tablet Take 1 tablet (81 mg total) by mouth daily. Swallow whole. 01/27/22   Maree, Pratik D, DO  atorvastatin  (LIPITOR) 40 MG tablet Take 1 tablet (40 mg total) by mouth daily at 6 PM. 01/27/22 03/28/22  Maree, Pratik D, DO  azelastine  (ASTELIN ) 0.1 % nasal spray Place 1 spray into both nostrils 2 (two) times daily. Use in each nostril as directed Patient taking differently: Place 1 spray into both nostrils daily as needed for allergies. Use in each  nostril as directed 01/09/21   Wurst, [place], PA-C  cyclobenzaprine  (FLEXERIL ) 5 MG tablet Take 1 tablet (5 mg total) by mouth 2 (two) times daily as needed for muscle spasms. 01/12/23   Leath-Warren, Etta PARAS, NP  fluticasone  (FLONASE ) 50 MCG/ACT nasal spray Place 1 spray into both nostrils daily. Patient taking differently: Place 1 spray into both nostrils daily as needed for allergies. 10/28/21   Horton, Charmaine FALCON, MD  metFORMIN (GLUCOPHAGE) 500 MG tablet Take 500 mg by mouth in the morning and at bedtime. 12/17/21   [provider]  sodium chloride  (OCEAN) 0.65 % SOLN nasal spray Place 1 spray into both nostrils as needed for congestion. 10/28/21   Horton, Charmaine FALCON, MD    No current facility-administered medications for this encounter.   Current Outpatient Medications  Medication Sig Dispense Refill   albuterol  (VENTOLIN  HFA) 108 (90 Base) MCG/ACT inhaler Inhale 1-2 puffs into the lungs every 4 (four) hours as needed for wheezing or shortness of breath.     allopurinol  (ZYLOPRIM ) 100 MG tablet Take 100 mg by mouth daily.     amLODipine  (NORVASC ) 10 MG tablet Take 10 mg by mouth daily.     aspirin  EC 81 MG tablet Take 1 tablet (81 mg total) by mouth daily. Swallow whole. 30 tablet 12   atorvastatin  (LIPITOR) 40 MG tablet Take 1 tablet (40 mg total) by mouth daily at 6 PM. 30 tablet 1   azelastine  (ASTELIN ) 0.1 %  nasal spray Place 1 spray into both nostrils 2 (two) times daily. Use in each nostril as directed (Patient taking differently: Place 1 spray into both nostrils daily as needed for allergies. Use in each nostril as directed) 30 mL 12   cyclobenzaprine  (FLEXERIL ) 5 MG tablet Take 1 tablet (5 mg total) by mouth 2 (two) times daily as needed for muscle spasms. 20 tablet 0   fluticasone  (FLONASE ) 50 MCG/ACT nasal spray Place 1 spray into both nostrils daily. (Patient taking differently: Place 1 spray into both nostrils daily as needed for allergies.) 16 g 2   metFORMIN (GLUCOPHAGE)  500 MG tablet Take 500 mg by mouth in the morning and at bedtime.     sodium chloride  (OCEAN) 0.65 % SOLN nasal spray Place 1 spray into both nostrils as needed for congestion. 480 mL 0    Allergies as of 05/11/2024 - Review Complete 05/11/2024  Allergen Reaction Noted   Penicillins Other (See Comments) 02/14/2012   Erythromycin Itching 03/19/2006    Family History  Problem Relation Age of Onset   Heart attack Father    Stroke Mother     Social History   Socioeconomic History   Marital status: Married    Spouse name: Not on file   Number of children: Not on file   Years of education: Not on file   Highest education level: Not on file  Occupational History   Not on file  Tobacco Use   Smoking status: Former    Types: Cigarettes   Smokeless tobacco: Never  Vaping Use   Vaping status: Never Used  Substance and Sexual Activity   Alcohol use: Yes    Comment: weekends   Drug use: No   Sexual activity: Never    Birth control/protection: None  Other Topics Concern   Not on file  Social History Narrative   Not on file   Social Drivers of Health   Financial Resource Strain: Not on file  Food Insecurity: Not on file  Transportation Needs: Not on file  Physical Activity: Not on file  Stress: Not on file  Social Connections: Not on file  Intimate Partner Violence: Not on file    Review of Systems: General: Negative for anorexia, weight loss, fever, chills, fatigue, weakness. Eyes: Negative for vision changes.  ENT: Negative for hoarseness, difficulty swallowing , nasal congestion. CV: Negative for chest pain, angina, palpitations, dyspnea on exertion, peripheral edema.  Respiratory: Negative for dyspnea at rest, dyspnea on exertion, cough, sputum, wheezing.  GI: See history of present illness. GU:  Negative for dysuria, hematuria, urinary incontinence, urinary frequency, nocturnal urination.  MS: Negative for joint pain, low back pain.  Derm: Negative for rash or  itching.  Neuro: Negative for weakness, abnormal sensation, seizure, frequent headaches, memory loss, confusion.  Psych: Negative for anxiety, depression Endo: Negative for unusual weight change.  Heme: Negative for bruising or bleeding. Allergy: Negative for rash or hives.  Physical Exam: Vital signs in last 24 hours: Temp:  [98.5 F (36.9 C)-99.1 F (37.3 C)] 98.5 F (36.9 C) (09/05 0518) Pulse Rate:  [43-81] 81 (09/05 0518) Resp:  [18-24] 18 (09/05 0518) BP: (133-155)/(75-87) 133/75 (09/05 0518) SpO2:  [98 %] 98 % (09/05 0518) Weight:  [98 kg] 98 kg (09/04 2243)   General:   Alert,  Well-developed, well-nourished, pleasant and cooperative in NAD Head:  Normocephalic and atraumatic. Eyes:  Sclera clear, no icterus.   Conjunctiva pink. Ears:  Normal auditory acuity. Nose:  No deformity, discharge,  or lesions. Mouth:  No deformity or lesions, dentition normal. Neck:  Supple; no masses or thyromegaly. Lungs:  Clear throughout to auscultation.   No wheezes, crackles, or rhonchi. No acute distress. Heart:  Regular rate and rhythm; no murmurs, clicks, rubs,  or gallops. Abdomen:  Soft, nontender and nondistended. No masses, hepatosplenomegaly or hernias noted. Normal bowel sounds, without guarding, and without rebound.   Msk:  Symmetrical without gross deformities. Normal posture. Pulses:  Normal pulses noted. Extremities:  Without clubbing or edema. Neurologic:  Alert and  oriented x4;  grossly normal neurologically. Skin:  Intact without significant lesions or rashes. Cervical Nodes:  No significant cervical adenopathy. Psych:  Alert and cooperative. Normal mood and affect.  Intake/Output from previous day: No intake/output data recorded. Intake/Output this shift: No intake/output data recorded.  Lab Results: Recent Labs    05/12/24 0043  WBC 5.9  HGB 13.0  HCT 38.9*  PLT 185   BMET Recent Labs    05/12/24 0043  NA 142  K 3.3*  CL 108  CO2 20*  GLUCOSE 138*   BUN 15  CREATININE 0.96  CALCIUM  9.4   LFT No results for input(s): PROT, ALBUMIN, AST, ALT, ALKPHOS, BILITOT, BILIDIR, IBILI in the last 72 hours. PT/INR No results for input(s): LABPROT, INR in the last 72 hours. Hepatitis Panel No results for input(s): HEPBSAG, HCVAB, HEPAIGM, HEPBIGM in the last 72 hours. C-Diff No results for input(s): CDIFFTOX in the last 72 hours.  Studies/Results: No results found.  Assessment: *Esophageal food impaction *Dysphagia *Chronic GERD  Plan: Will proceed with urgent endoscopy for food impaction removal.  Possible esophageal dilation depending on findings.   The risks including infection, bleed, or perforation as well as benefits, limitations, alternatives and imponderables have been reviewed with the patient. Potential for esophageal dilation, biopsy, etc. have also been reviewed.  Questions have been answered. All parties agreeable.  Keep patient NPO.   Carlin POUR. Cindie, D.O. Gastroenterology and Hepatology [facility]    LOS: 0 days     05/12/2024, 10:17 AM

## 2024-05-15 ENCOUNTER — Encounter (HOSPITAL_COMMUNITY): Payer: Self-pay | Admitting: Internal Medicine

## 2024-05-15 NOTE — Telephone Encounter (Signed)
 ATC mobile, went straight to vm but vm box full. ATC home number and advised that it was the wrong number.

## 2024-05-16 NOTE — Telephone Encounter (Signed)
 ATC pt, no answer. LVM, letter sent.

## 2024-08-01 LAB — COLOGUARD: COLOGUARD: NEGATIVE

## 2024-09-03 ENCOUNTER — Emergency Department (HOSPITAL_COMMUNITY)
Admission: EM | Admit: 2024-09-03 | Discharge: 2024-09-03 | Disposition: A | Discharge status: Home / Self Care | Attending: Emergency Medicine | Admitting: Emergency Medicine

## 2024-09-03 ENCOUNTER — Emergency Department (HOSPITAL_COMMUNITY)

## 2024-09-03 ENCOUNTER — Encounter (HOSPITAL_COMMUNITY): Payer: Self-pay

## 2024-09-03 ENCOUNTER — Other Ambulatory Visit: Payer: Self-pay

## 2024-09-03 DIAGNOSIS — Z79899 Other long term (current) drug therapy: Secondary | ICD-10-CM | POA: Insufficient documentation

## 2024-09-03 DIAGNOSIS — E119 Type 2 diabetes mellitus without complications: Secondary | ICD-10-CM | POA: Diagnosis not present

## 2024-09-03 DIAGNOSIS — I10 Essential (primary) hypertension: Secondary | ICD-10-CM | POA: Insufficient documentation

## 2024-09-03 DIAGNOSIS — W19XXXA Unspecified fall, initial encounter: Secondary | ICD-10-CM

## 2024-09-03 DIAGNOSIS — Z7982 Long term (current) use of aspirin: Secondary | ICD-10-CM | POA: Diagnosis not present

## 2024-09-03 DIAGNOSIS — Y9301 Activity, walking, marching and hiking: Secondary | ICD-10-CM | POA: Diagnosis not present

## 2024-09-03 DIAGNOSIS — M25551 Pain in right hip: Secondary | ICD-10-CM | POA: Insufficient documentation

## 2024-09-03 DIAGNOSIS — M545 Low back pain, unspecified: Secondary | ICD-10-CM | POA: Insufficient documentation

## 2024-09-03 DIAGNOSIS — Z7984 Long term (current) use of oral hypoglycemic drugs: Secondary | ICD-10-CM | POA: Insufficient documentation

## 2024-09-03 DIAGNOSIS — W108XXA Fall (on) (from) other stairs and steps, initial encounter: Secondary | ICD-10-CM | POA: Insufficient documentation

## 2024-09-03 MED ORDER — KETOROLAC TROMETHAMINE 30 MG/ML IJ SOLN: 15.0000 mg | Freq: Once | INTRAMUSCULAR | Status: DC

## 2024-09-03 MED ORDER — IBUPROFEN 800 MG PO TABS: 800.0000 mg | ORAL_TABLET | Freq: Three times a day (TID) | ORAL | 0 refills | Status: AC | PRN

## 2024-09-03 MED ORDER — KETOROLAC TROMETHAMINE 30 MG/ML IJ SOLN
15.0000 mg | Freq: Once | INTRAMUSCULAR | Status: AC
  Administered 2024-09-03: 15 mg via INTRAMUSCULAR
  Filled 2024-09-03: qty 1

## 2024-09-03 NOTE — ED Triage Notes (Signed)
 Pt presents with injuries from a fall that happened 3 days ago. He reports he was knocked over by his dog and fell down 3 stairs. He is c/o low back pain and R hip pain. Pt is ambulatory with a cane.

## 2024-09-03 NOTE — ED Notes (Signed)
 Patient transported to X-ray

## 2024-09-03 NOTE — Discharge Instructions (Signed)
 Thank you for visiting the Emergency Department today. It was a pleasure to be part of your healthcare team.   Your were seen today for evaluation after a fall, and your imaging showed no fracture or dislocation As discussed, utilize rest, heat, ice as needed for pain relief.  You may take Tylenol  or ibuprofen  as needed for relief of pain.  It is important to watch for warning signs such as worsening pain or inability to ambulate.  If any of these happen, return to the Emergency Department or call 911.  Thank you for trusting us  with your health.

## 2024-09-03 NOTE — ED Provider Notes (Signed)
 " [place] [facility] AT [facility] Provider Note   CSN: [id number] Arrival date & time: 09/03/24  1336     Patient presents with: Ian Roberson is a 73 y.o. male who presents with right hip pain, ongoing for 3 days.  The pain is described as an intense sharp/cramping pain that is wax/wane in quality.  The patient states that the pain is worsened with movement and improved with rest.  The patient states that the pain started after he fell down 3 stairs on his front porch while walking his dog.  Patient states that he did not hit his head, denies LOC, denies anticoagulation.  There is no associated numbness, tingling, weakness, joint instability, deformity, or loss of function.  There is no history of prior injury or surgery to the affected area. The patient has tried over-the-counter analgesia for symptom relief with moderate improvement.  The patient is in no acute distress.    Fall       Prior to Admission medications  Medication Sig Start Date End Date Taking? Authorizing Provider  albuterol  (VENTOLIN  HFA) 108 (90 Base) MCG/ACT inhaler Inhale 1-2 puffs into the lungs every 4 (four) hours as needed for wheezing or shortness of breath. 12/11/20   [provider]  allopurinol  (ZYLOPRIM ) 100 MG tablet Take 100 mg by mouth daily.    [provider]  amLODipine  (NORVASC ) 10 MG tablet Take 10 mg by mouth daily.    [provider]  aspirin  EC 81 MG tablet Take 1 tablet (81 mg total) by mouth daily. Swallow whole. 01/27/22   Maree, Pratik D, DO  atorvastatin  (LIPITOR) 40 MG tablet Take 1 tablet (40 mg total) by mouth daily at 6 PM. 01/27/22 03/28/22  Maree, Pratik D, DO  azelastine  (ASTELIN ) 0.1 % nasal spray Place 1 spray into both nostrils 2 (two) times daily. Use in each nostril as directed Patient taking differently: Place 1 spray into both nostrils daily as needed for allergies. Use in each nostril as directed 01/09/21   Wurst, Brittany,  PA-C  cyclobenzaprine  (FLEXERIL ) 5 MG tablet Take 1 tablet (5 mg total) by mouth 2 (two) times daily as needed for muscle spasms. 01/12/23   Leath-Warren, Etta PARAS, NP  fluticasone  (FLONASE ) 50 MCG/ACT nasal spray Place 1 spray into both nostrils daily. Patient taking differently: Place 1 spray into both nostrils daily as needed for allergies. 10/28/21   Horton, Charmaine FALCON, MD  metFORMIN (GLUCOPHAGE) 500 MG tablet Take 500 mg by mouth in the morning and at bedtime. 12/17/21   [provider]  pantoprazole  (PROTONIX ) 40 MG tablet Take 1 tablet (40 mg total) by mouth daily. 05/12/24 05/12/25  Carver, Charles K, DO  sodium chloride  (OCEAN) 0.65 % SOLN nasal spray Place 1 spray into both nostrils as needed for congestion. 10/28/21   Horton, Charmaine FALCON, MD    Allergies: Penicillins and Erythromycin    Review of Systems  Musculoskeletal:  Positive for back pain.    Updated Vital Signs BP (!) 140/89   Pulse 98   Temp 98.6 F (37 C) (Oral)   Resp 17   Ht 6' 2 (1.88 m)   Wt 102.1 kg   SpO2 100%   BMI 28.89 kg/m   Physical Exam Vitals and nursing note reviewed.  Constitutional:      General: He is not in acute distress.    Appearance: Normal appearance.  HENT:     Head: Normocephalic and atraumatic.  Eyes:  Extraocular Movements: Extraocular movements intact.     Conjunctiva/sclera: Conjunctivae normal.     Pupils: Pupils are equal, round, and reactive to light.  Cardiovascular:     Rate and Rhythm: Normal rate and regular rhythm.     Pulses: Normal pulses.  Pulmonary:     Effort: Pulmonary effort is normal. No respiratory distress.     Comments: Patient has no difficulty speaking in complete sentences. Chest:     Chest wall: No deformity, swelling, tenderness or crepitus.  Abdominal:     General: Abdomen is flat.     Palpations: Abdomen is soft.     Tenderness: There is no abdominal tenderness.  Musculoskeletal:        General: Normal range of motion.     Cervical  back: Normal range of motion.     Lumbar back: Tenderness present. No signs of trauma or bony tenderness.     Right hip: Tenderness present. No deformity, bony tenderness or crepitus. Normal range of motion.     Left hip: Normal. No tenderness.     Right upper leg: Normal.     Comments: Patient has mild tenderness to palpation to right lumbar paraspinal muscles.  No obvious deformity or trauma visualized.  There is mild tenderness to palpation over patient's right iliac crest.   Skin:    General: Skin is warm and dry.     Capillary Refill: Capillary refill takes less than 2 seconds.  Neurological:     General: No focal deficit present.     Mental Status: He is alert. Mental status is at baseline.     Comments: Patient alert and oriented.  Speech clear and appropriate.  No aphasia or dysarthria.   Motor strength 5-5 in all extremities with normal tone and no pronator drift.   Sensation intact to light touch in upper and lower extremities bilaterally.  Gait steady when ambulated in ED.  No focal neurologic deficits appreciated.   Psychiatric:        Mood and Affect: Mood normal.     (all labs ordered are listed, but only abnormal results are displayed) Labs Reviewed - No data to display  EKG: None  Radiology: No results found.    Procedures   Medications Ordered in the ED  ketorolac  (TORADOL ) 30 MG/ML injection 15 mg (15 mg Intramuscular Given 09/03/24 1807)                                 Medical Decision Making Amount and/or Complexity of Data Reviewed Radiology: ordered. Decision-making details documented in ED Course.  Risk Prescription drug management.   Patient presents to the ED for: Right hip pain This involves an extensive number of treatment options  Differential diagnosis includes:  Minor MSK etiology Traumatic etiology Co-morbid conditions: Hypertension, diabetes  Additional history/records obtained and reviewed: Additional history obtained from   family who present as good historians and witnesses to patient's fall.  Clinical Course as of 09/11/24 0518  Sun Sep 03, 2024  1734 Temp: 98.6 F (37 C) Vital stable, patient in no acute distress [ML]  1734 DG Hip Unilat  With Pelvis 2-3 Views Right No acute findings [ML]  1815 Patient given ketorolac  30 mg IM for symptomatic relief-well-tolerated [ML]  1826 DG Lumbar Spine 2-3 Views No acute findings [ML]    Clinical Course User Index [ML] Willma Duwaine CROME, PA    Data Reviewed / Actions Taken: Imaging  ordered/reviewed with my independent interpretation in ED course above. I agree with the radiologists interpretation.   Management / Treatments: See ED course above for medications, treatments administered, and clinical rationale.   Reevaluation of the patient after these medicines showed that the patient improved. I have reviewed the patients home medicines and have made adjustments as needed  ED Course / Reassessments: Problem List: Right hip pain, back pain 73 year old male presented for right hip and back pain after a fall. Initial assessment included history, physical exam, and review of prior medical records. Based on the reassuring exam findings and there was low clinical suspicion for fracture, dislocation, or neurovascular injury.  Imaging demonstrated no acute osseous abnormality. The patient was treated conservatively with analgesia and supportive care, with no worsening of symptoms during ED course.  The patient was deemed appropriate for discharge with likely musculoskeletal strain/sprain.  Discharge instructions including rest, activity modification, ice/heat as appropriate, and use of NSAIDs or acetaminophen  for pain control.  The patient was advised to follow-up with primary care/orthopedics if symptoms persist or worsen.  Strict return precautions were provided for increasing pain, numbness, weakness or loss of function.    Patient response: Improved with ED management Serial  reassessments performed: Yes    Disposition: Disposition: Discharge with close follow-up PCP for further evaluation and care Rationale for disposition: Stable for discharge The disposition plan and rationale were discussed with the patient at the bedside, all questions were addressed, and the patient demonstrated understanding.  This note was produced using [employer]. While I have reviewed and verified all clinical information, transcription errors may remain.      Final diagnoses:  Fall, initial encounter    ED Discharge Orders          Ordered    ibuprofen  (ADVIL ) 800 MG tablet  Every 8 hours PRN        09/03/24 1942               Gregory Dowe L, [place] 09/11/24 [id number]    Patsey Lot, MD 09/11/24 1458  "
